# Patient Record
Sex: Female | Born: 1935 | Race: White | Hispanic: No | Smoking: Never smoker
Health system: Southern US, Community
[De-identification: ages and names within clinical notes are randomized; demographics above are authoritative.]

## PROBLEM LIST (undated history)

## (undated) DIAGNOSIS — I739 Peripheral vascular disease, unspecified: Secondary | ICD-10-CM

## (undated) DIAGNOSIS — E785 Hyperlipidemia, unspecified: Secondary | ICD-10-CM

## (undated) DIAGNOSIS — I10 Essential (primary) hypertension: Secondary | ICD-10-CM

## (undated) DIAGNOSIS — H409 Unspecified glaucoma: Secondary | ICD-10-CM

## (undated) HISTORY — DX: Hyperlipidemia, unspecified: E78.5

## (undated) HISTORY — DX: Unspecified glaucoma: H40.9

## (undated) HISTORY — DX: Essential (primary) hypertension: I10

## (undated) HISTORY — DX: Peripheral vascular disease, unspecified: I73.9

## (undated) HISTORY — PX: EYE SURGERY: SHX253

---

## 2012-07-15 ENCOUNTER — Inpatient Hospital Stay: Payer: Self-pay | Admitting: Surgery

## 2012-07-15 LAB — CBC
MCHC: 33.8 g/dL (ref 32.0–36.0)
Platelet: 257 10*3/uL (ref 150–440)
RDW: 13 % (ref 11.5–14.5)
WBC: 6.1 10*3/uL (ref 3.6–11.0)

## 2012-07-15 LAB — COMPREHENSIVE METABOLIC PANEL
Albumin: 3.7 g/dL (ref 3.4–5.0)
Anion Gap: 8 (ref 7–16)
Glucose: 105 mg/dL — ABNORMAL HIGH (ref 65–99)
Osmolality: 282 (ref 275–301)
Potassium: 3.8 mmol/L (ref 3.5–5.1)
SGPT (ALT): 23 U/L (ref 12–78)
Sodium: 141 mmol/L (ref 136–145)
Total Protein: 6.8 g/dL (ref 6.4–8.2)

## 2012-07-15 LAB — LIPASE, BLOOD: Lipase: 157 U/L (ref 73–393)

## 2012-07-15 LAB — URINALYSIS, COMPLETE
Glucose,UR: NEGATIVE mg/dL (ref 0–75)
Leukocyte Esterase: NEGATIVE
Ph: 7 (ref 4.5–8.0)
Protein: NEGATIVE
RBC,UR: 13 /HPF (ref 0–5)
WBC UR: 1 /HPF (ref 0–5)

## 2012-07-16 LAB — COMPREHENSIVE METABOLIC PANEL
Albumin: 3 g/dL — ABNORMAL LOW (ref 3.4–5.0)
Anion Gap: 7 (ref 7–16)
BUN: 12 mg/dL (ref 7–18)
Calcium, Total: 8.7 mg/dL (ref 8.5–10.1)
Chloride: 109 mmol/L — ABNORMAL HIGH (ref 98–107)
EGFR (African American): >60
EGFR (Non-African Amer.): >60
Osmolality: 281 (ref 275–301)
Potassium: 3.5 mmol/L (ref 3.5–5.1)
Sodium: 141 mmol/L (ref 136–145)
Total Protein: 5.9 g/dL — ABNORMAL LOW (ref 6.4–8.2)

## 2012-07-16 LAB — CBC WITH DIFFERENTIAL/PLATELET
Basophil %: 0.9 %
Eosinophil %: 2.3 %
HCT: 39.2 % (ref 35.0–47.0)
HGB: 13.2 g/dL (ref 12.0–16.0)
Lymphocyte #: 1.5 10*3/uL (ref 1.0–3.6)
MCH: 31.5 pg (ref 26.0–34.0)
MCHC: 33.7 g/dL (ref 32.0–36.0)
MCV: 93 fL (ref 80–100)
Monocyte #: 0.6 x10 3/mm (ref 0.2–0.9)
Monocyte %: 8.1 %
Neutrophil #: 4.9 10*3/uL (ref 1.4–6.5)
Platelet: 255 10*3/uL (ref 150–440)

## 2012-07-20 LAB — PLATELET COUNT: Platelet: 251 10*3/uL (ref 150–440)

## 2012-07-25 ENCOUNTER — Ambulatory Visit: Payer: Self-pay | Admitting: Surgery

## 2012-07-25 LAB — URINALYSIS, COMPLETE
Bilirubin,UR: NEGATIVE
Nitrite: NEGATIVE
Specific Gravity: 1.014 (ref 1.003–1.030)
Squamous Epithelial: 1
WBC UR: 12 /HPF (ref 0–5)

## 2012-07-27 LAB — URINE CULTURE

## 2012-12-04 ENCOUNTER — Emergency Department: Payer: Self-pay | Admitting: Internal Medicine

## 2012-12-09 ENCOUNTER — Emergency Department: Payer: Self-pay | Admitting: Emergency Medicine

## 2013-01-21 ENCOUNTER — Ambulatory Visit: Payer: Self-pay | Admitting: Ophthalmology

## 2013-02-18 ENCOUNTER — Ambulatory Visit: Payer: Self-pay | Admitting: Ophthalmology

## 2014-06-05 IMAGING — CT CT HEAD WITHOUT CONTRAST
1 of 2 series · 13 of 30 positions shown, 17 images · non-contrast
Comparison: none

REASON FOR EXAM: s/p fall
COMMENTS:

[Series 4: soft tissue 2 · axial · 0.40mm/px · z∈[+1305,+1430]mm · 13 of 31 slices shown, 17 images]
[im 3/31  brain]
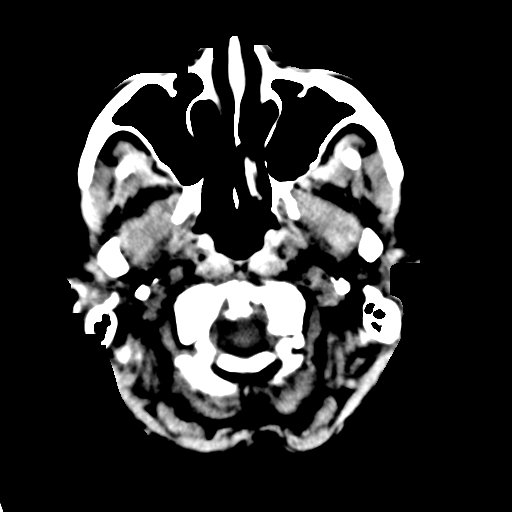
[im 3/31  bone]
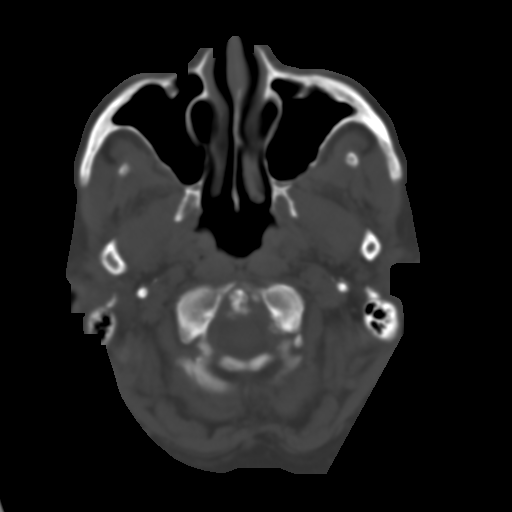
[im 5/31  brain]
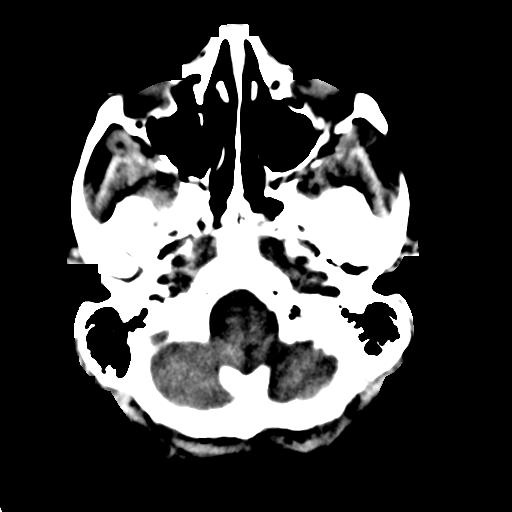
[im 7/31  brain]
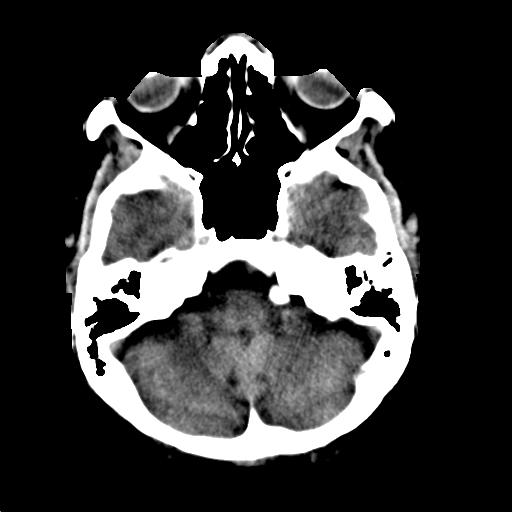
[im 9/31  brain]
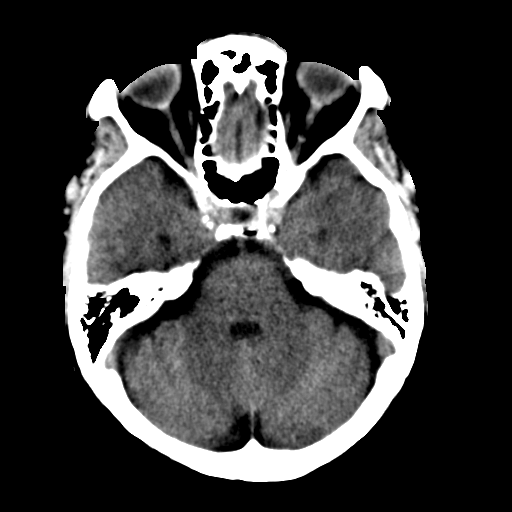
[im 11/31  brain]
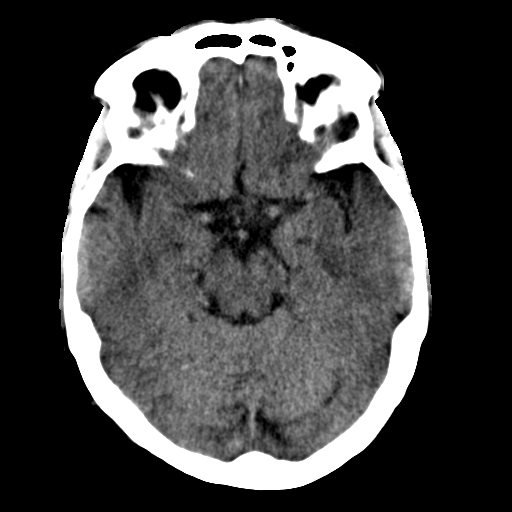
[im 11/31  bone]
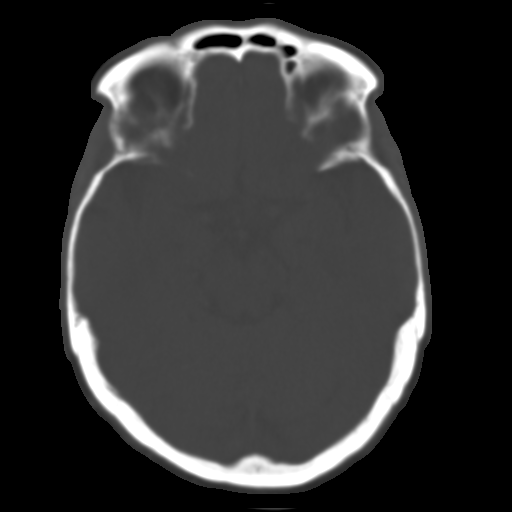
[im 13/31  brain]
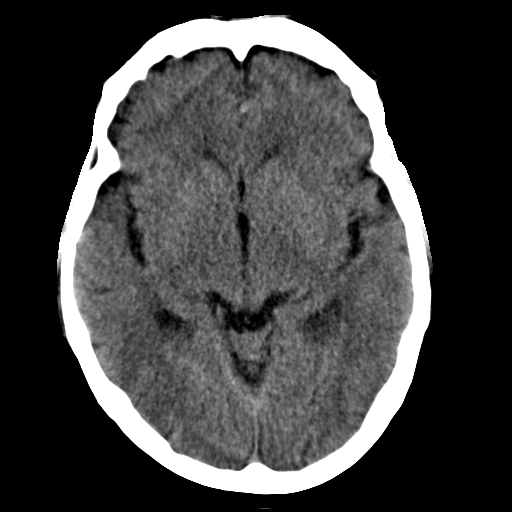
[im 16/31  brain]
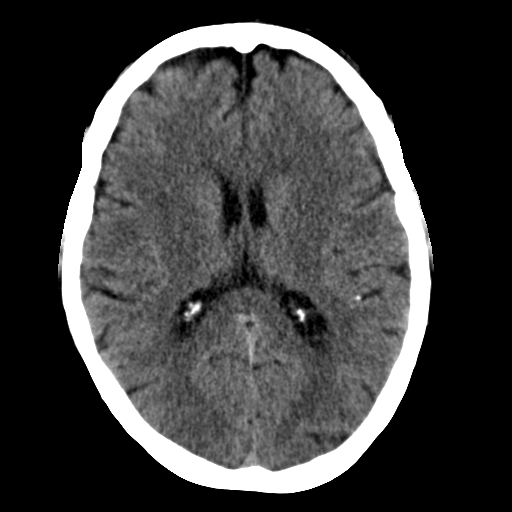
[im 18/31  brain]
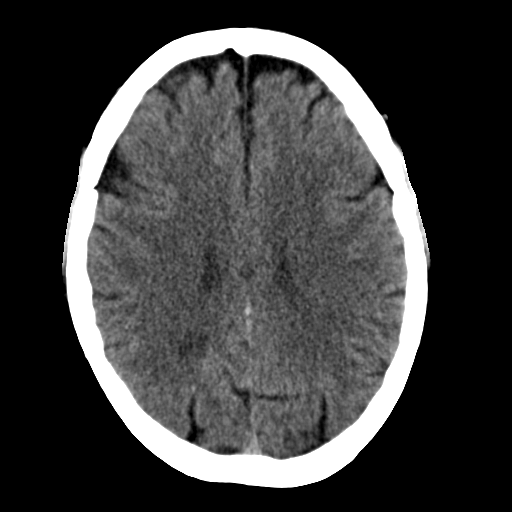
[im 20/31  brain]
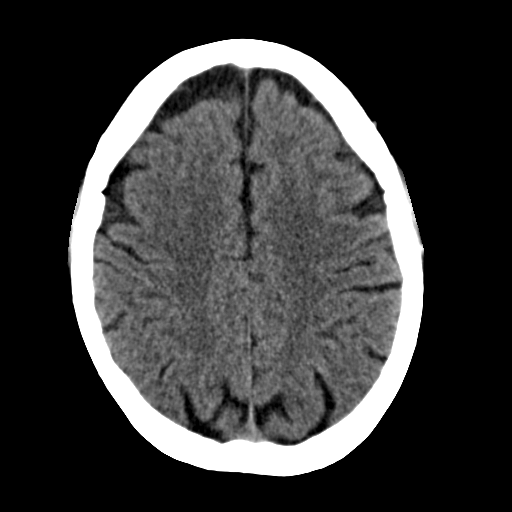
[im 20/31  bone]
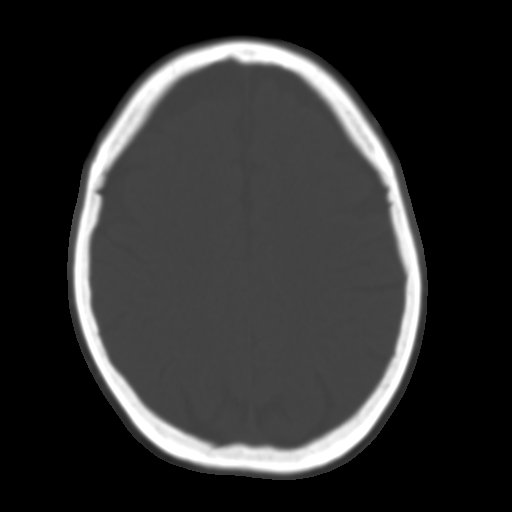
[im 22/31  brain]
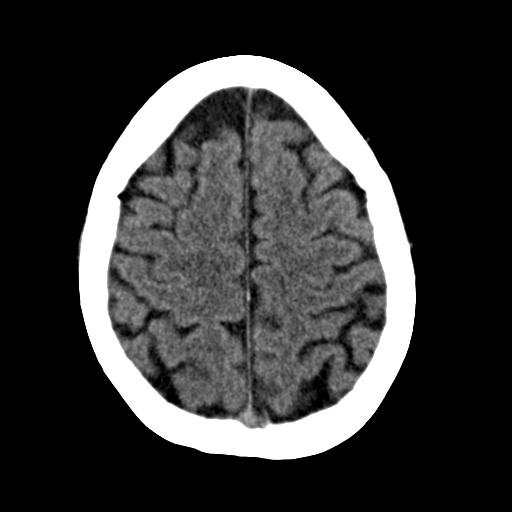
[im 24/31  brain]
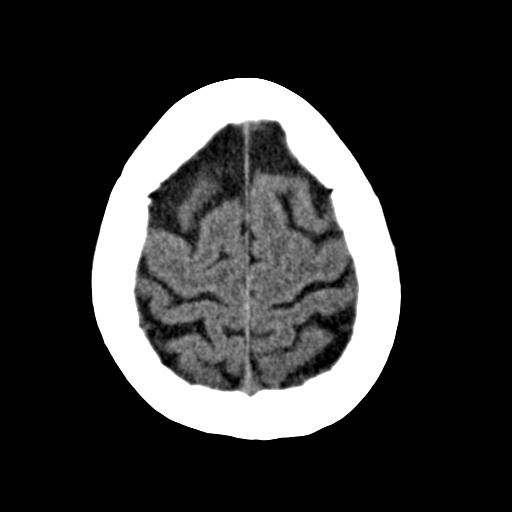
[im 26/31  brain]
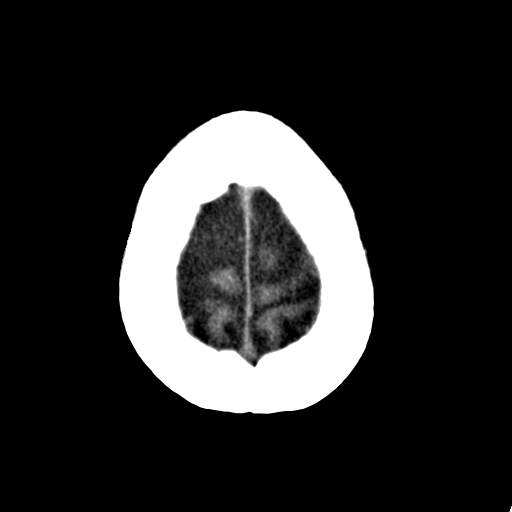
[im 28/31  brain]
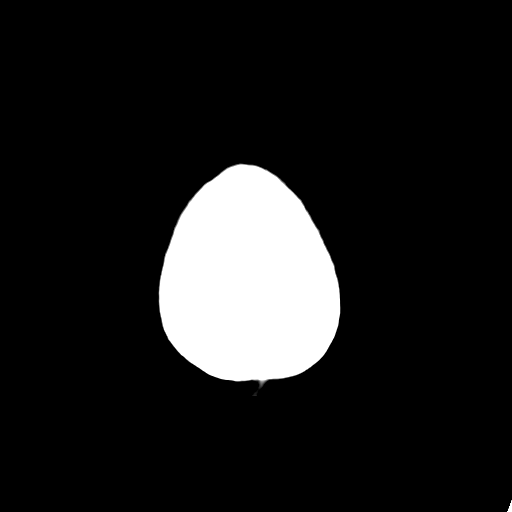
[im 28/31  bone]
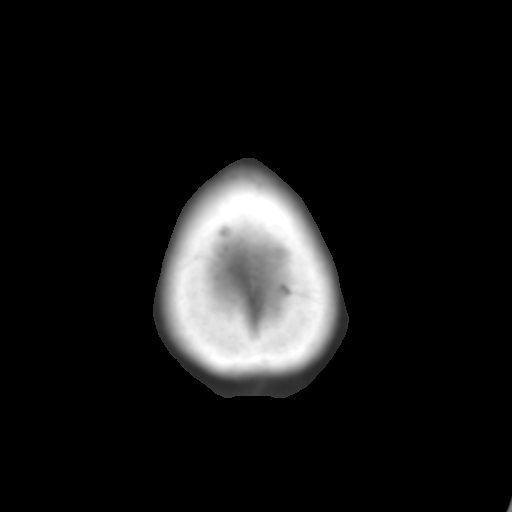

[13 of 30 positions shown; findings below may reference images not displayed]

PROCEDURE:     CT  - CT HEAD WITHOUT CONTRAST  - December 04, 2012  [DATE]

RESULT:     Axial noncontrast CT scanning was performed through the brain
with reconstructions at 5 mm intervals and slice thicknesses. There are no
previous studies for comparison.

There is very mild age-appropriate diffuse cerebral and cerebellar atrophy.
The ventricles are normal in size and position. There is no intracranial
hemorrhage nor intracranial mass effect. There is decreased density in the
deep white matter of both parietal lobes consistent with mild changes of
chronic small vessel ischemia. There is no evidence of an evolving ischemic
infarction.

At bone window settings soft tissue swelling and probable laceration is
noted over the forehead especially on the left. There is no evidence of an
acute skull fracture. The observed portions of the paranasal sinuses and
mastoid air cells are clear.
IMPRESSION: 1. There is soft tissue swelling over the forehead. There is no evidence of
an acute skull fracture.
2. There is no acute intracranial hemorrhage nor evidence of an evolving
ischemic infarction.
3. There are mild age related changes present in both cerebral hemispheres.

[REDACTED]

## 2014-11-10 NOTE — Op Note (Signed)
PATIENT NAME:  Amy [name], Amy [name] MR#:  [id number] DATE OF BIRTH:  1927-03-29  DATE OF PROCEDURE:  07/16/2012  ATTENDING PHYSICIAN:  Salome Holmeshris Contrell Ballentine, M.D.  DATE OF SURGERY. 07/16/2012.   PREOPERATIVE DIAGNOSIS: Incarcerated epigastric hernia.   POSTOPERATIVE DIAGNOSIS: Incarcerated epigastric hernia x 1, an incarcerated epigastric hernia inferior to this x 2 and reducible umbilical hernia x 3.   PROCEDURE PERFORMED:  1.  Repair of epigastric hernia x 2 and umbilical hernia by connecting hernias and repairing 4 x 4 cm defect with 8 x 8 cm Ventralex patch.  2.  Partial omentectomy.   ESTIMATED BLOOD LOSS: 20 mL.   ANESTHESIA: General.   SPECIMEN: Incarcerated omentum.   INDICATION FOR SURGERY: The patient is an [age] year old female who presented with acute onset of epigastric pain and a hard mass. This had reduced and she was found to have epigastric hernia. Due to the fact that she had a large amount of pain, [name] was afraid that she could end up having incarcerated bowel through this small defect and, therefore, elected to bring her to the Operating Room for repair of epigastric hernia. She also was noted to have an umbilical hernia and [name] elected to repair that if it was easy to do so at that time.   DETAILS OF PROCEDURE: Informed consent was obtained. The patient was brought to the Operating Room suite. She was induced, endotracheal tube was placed and general anesthesia was administered. Antibiotics were administered. Her abdomen was then prepped and draped in standard surgical fashion. A timeout was then performed, correctly identifying the patient name, operative site and procedure to be performed.   [name] then made a supraumbilical incision above the palpated hernia. [name] was able to visualize the hernia sac immediately.  [name] then encircled the hernia and there was a large piece of incarcerated omentum. [name] opened the sac, tried to reduce the omentum, but was unable to so, therefore, did an omentectomy and  was able to reduce the omentum. [name] then cleared out the fascia adjacent and there was an approximately 2 x 2 cm defect. [name] then made sure the underside was cleaned and there was no obvious adherent bowel. [name] then tracked inferiorly and found a small approximately 0.5 x 0.5 cm epigastric hernia and then [name] went deep and further down and found an umbilical hernia. To take the umbilical hernia at the umbilicus [name] used a Kelly clamp to encircle the umbilical stalk and then carefully divided it.  [name] then got rid of all the adjacent sac to the defects and incised a small amount of tissue between each of them to make a total 4 x 4 cm defect. [name] then elected to repair this.  [name] used an 8 x 8 cm Ventralex patch which fit nicely through the hole and underneath, and gave an amount of overlap. [name] then placed 4 interrupted 0 Prolene sutures at the 4 corners through the fascia and through the anterior part of the patch.   [name] then closed the midline incision using a running #1 Prolene and grabbing a piece of the tails of the Ventralex patch to incorporate this patch into the repair.  [name] then used a couple of figure-of-eights to further close the fascia where it appeared to be a little bit loose. [name] then used a simple 3-0 Vicryl U-stitch to attach the repair to the umbilicus to reform her umbilicus, and then irrigated the wound and closed it in 3 layers; a running 3-0 Vicryl to close some  of the subcutaneous tissue and close the dead space, a running 3-0 Vicryl deep dermal and a running 4-0 Monocryl subcuticular. [name] then used Steri-Strips and gauze and Telfa and Tegaderm to complete the dressing. She was then awoken, extubated and brought to the postanesthesia care unit. There were no immediate complications. Needle, sponge and instrument counts were correct at the end of the procedure.    ____________________________ Si Raider. Amy Barb, MD cal:cs D: 07/16/2012 17:14:19 ET T: 07/16/2012 19:36:51  ET JOB#: [id number]  cc: Cristal Deer A. Lucas Exline, MD, <Dictator> Jarvis Newcomer MD ELECTRONICALLY SIGNED 07/17/2012 13:52

## 2014-11-10 NOTE — H&P (Signed)
PATIENT NAME:  Amy [name], Amy [name] MR#:  [id number] DATE OF BIRTH:  April 10, 1927  DATE OF ADMISSION:  07/15/2012  ATTENDING PHYSICIAN: Cristal Deerhristopher A. Janessa Mickle, M.D.   REASON FOR CONSULTATION: Abdominal pain x1 day and firm epigastric bulge.   HISTORY OF PRESENT ILLNESS: Amy [name] is a pleasant 81 year old female who is not on any standard medications at home but has a history a hysterectomy, who presented with a one-day history of epigastric bulge. She said that she has felt it before, and when she was leaning over and straining she felt it pop out.  It became hard and became painful. It did not improve with lying down or anything like that. No nausea, vomiting, diarrhea, constipation, but she was brought to the ED.  She did receive some morphine, and it is much softer now and improved. No fevers, chills, night sweats, shortness of breath, cough, chest pain, nausea, vomiting, diarrhea, constipation, dysuria or hematuria. She  has never had colonoscopy before.   PAST SURGICAL HISTORY:   1. Status post hysterectomy, total.  2. Epigastric hernia. 3. Umbilical hernia.   HOME MEDICATIONS: Ibuprofen p.r.n.   ALLERGIES: No known drug allergies.   SOCIAL HISTORY: She is here with her daughter who lives in [place]. No tobacco or alcohol use. No other drug use.   FAMILY HISTORY: Denies cancer, or heart disease, or hypertension or diabetes.   FAMILY HISTORY: He does have family history of stroke.   REVIEW OF SYSTEMS: A total 12-point review of systems is obtained. Pertinent positives and negatives as above.    PHYSICAL EXAMINATION: VITAL SIGNS: Temperature 86, blood pressure 180/70, respirations 20, 98% on room air.  HEAD: Normocephalic, atraumatic.  EYES: No scleral icterus. No conjunctivitis.  HEAD: Normocephalic, atraumatic. No obvious facial injuries. No obvious facial trauma. Normal external nose. Normal external ears.  NECK: Supple. No obvious masses.  HEART: Regular rate and rhythm. No  murmurs, rubs, or gallops.  CHEST: Lungs clear to auscultation, moving air well.  ABDOMEN: Soft, slightly tender epigastrium with a bulge which is not reducible but is relatively nontender. Also, there is an umbilical hernia which is palpable.  EXTREMITIES: Moves all extremities well. Strength 5 out of 6.  Cranial nerves II through XII are grossly intact. . Sensation in all 4 extremities.   LABORATORY AND RADIOLOGICAL DATA:  Significant for CBC is normal. Hemoglobin and hematocrit is normal. Normal urinalysis. Lactate 1.1, creatinine 0.68, potassium 3.8.   CT scan shows an epigastric and umbilical hernia with no obvious bowel.   ASSESSMENT AND PLAN: The patient is a pleasant 81 year old female with a history of epigastric pain and what sounds like a large mass, likely incarcerated hernia, which has been reduced. She is fearful that it will recur and would like to have it fixed as soon as possible. We will admit overnight and plan on operative repair in the morning. [name] have spoken to her about the procedure, and she agrees with it.   ____________________________ Si Raiderhristopher A. Talik Casique, MD cal:cb D: 07/15/2012 15:35:02 ET T: 07/15/2012 15:54:12 ET JOB#: [id number]  cc: Cristal Deerhristopher A. Lamar Naef, MD, <Dictator> Jarvis NewcomerHRISTOPHER A Laithan Conchas MD ELECTRONICALLY SIGNED 07/17/2012 13:51

## 2014-11-13 NOTE — Op Note (Signed)
PATIENT NAME:  Amy [name], [name] MR#:  [id number] DATE OF BIRTH:  1927/01/10  DATE OF PROCEDURE:  01/21/2013  PREOPERATIVE DIAGNOSIS: Visually significant cataract of the left eye.   POSTOPERATIVE DIAGNOSIS: Visually significant cataract of the left eye.   OPERATIVE PROCEDURE: Cataract extraction by phacoemulsification with implant of intraocular lens to left eye.   SURGEON: Galen ManilaWilliam Oluwademilade Kellett, MD   ANESTHESIA:  1. Managed anesthesia care.  2. Topical tetracaine drops followed by 2% Xylocaine jelly applied in the preoperative holding area.   COMPLICATIONS: None.   TECHNIQUE: Stop and chop.  DESCRIPTION OF PROCEDURE: The patient was examined and consented in the preoperative holding area where the aforementioned topical anesthesia was applied to the left eye and then brought back to the Operating Room where the left eye was prepped and draped in the usual sterile ophthalmic fashion and a lid speculum was placed. A paracentesis was created with the side port blade and the anterior chamber was filled with viscoelastic. A near clear corneal incision was performed with the steel keratome. A continuous curvilinear capsulorrhexis was performed with a cystotome followed by the capsulorrhexis forceps. Hydrodissection and hydrodelineation were carried out with BSS on a blunt cannula. The lens was removed in a stop and chop technique and the remaining cortical material was removed with the irrigation-aspiration handpiece. The capsular bag was inflated with viscoelastic and the Alcon SN60WF Tecnis ZCB00 21.5-diopter lens, serial number [id number] was placed in the capsular bag without complication. The remaining viscoelastic was removed from the eye with the irrigation-aspiration handpiece. The wounds were hydrated. The anterior chamber was flushed with Miostat and the eye was inflated to physiologic pressure. 0.1 mL of cefuroxime concentration 10 mg/mL was placed in the anterior chamber. The wounds were found to  be water tight. The eye was dressed with Vigamox. The patient was given protective glasses to wear throughout the day and a shield with which to sleep tonight. The patient was also given drops with which to begin a drop regimen today and will follow-up with me in one day.    ____________________________ Jerilee FieldWilliam L. Makeda Peeks, MD wlp:jm D: 01/21/2013 17:28:37 ET T: 01/21/2013 20:52:11 ET JOB#: [id number]  cc: Ludy Messamore L. Tanner Vigna, MD, <Dictator> Jerilee FieldWILLIAM L Cecile Guevara MD ELECTRONICALLY SIGNED 01/22/2013 15:59

## 2014-11-13 NOTE — Op Note (Signed)
PATIENT NAME:  Amy [name], Amy [name] MR#:  [id number] DATE OF BIRTH:  [birth year]  DATE OF PROCEDURE:  02/18/2013  LOCATION:  [facility]  PREOPERATIVE DIAGNOSIS: Visually significant cataract of the right eye.   POSTOPERATIVE DIAGNOSIS: Visually significant cataract of the right eye.   OPERATIVE PROCEDURE: Cataract extraction by phacoemulsification with implant of intraocular lens to right eye.   SURGEON: Galen ManilaWilliam Levar Fayson, MD.   ANESTHESIA:  1. Managed anesthesia care.  2. Topical tetracaine drops followed by 2% Xylocaine jelly applied in the preoperative holding area.   COMPLICATIONS: None.   TECHNIQUE:  Stop and chop.  DESCRIPTION OF PROCEDURE: The patient was examined and consented in the preoperative holding area where the aforementioned topical anesthesia was applied to the right eye and then brought back to the Operating Room where the right eye was prepped and draped in the usual sterile ophthalmic fashion and a lid speculum was placed. A paracentesis was created with the side port blade and the anterior chamber was filled with viscoelastic. A near clear corneal incision was performed with the steel keratome. A continuous curvilinear capsulorrhexis was performed with a cystotome followed by the capsulorrhexis forceps. Hydrodissection and hydrodelineation were carried out with BSS on a blunt cannula. The lens was removed in a stop and chop technique and the remaining cortical material was removed with the irrigation-aspiration handpiece. The capsular bag was inflated with viscoelastic and the Technis ZCB00 21.0-diopter lens, serial number [id number] was placed in the capsular bag without complication. The remaining viscoelastic was removed from the eye with the irrigation-aspiration handpiece. The wounds were hydrated. The anterior chamber was flushed with Miostat and the eye was inflated to physiologic pressure. 0.1 mL of cefuroxime concentration 10 mg/mL was placed in the anterior  chamber. The wounds were found to be water tight. The eye was dressed with Vigamox and Combigan. The patient was given protective glasses to wear throughout the day and a shield with which to sleep tonight. The patient was also given drops with which to begin a drop regimen today and will follow-up with me in one day.     ____________________________ Jerilee FieldWilliam L. Izzy Doubek, MD wlp:dp D: 02/18/2013 11:13:57 ET T: 02/18/2013 11:29:19 ET JOB#: [id number]  cc: Nekita Pita L. Adewale Pucillo, MD, <Dictator> Jerilee FieldWILLIAM L Shasta Chinn MD ELECTRONICALLY SIGNED 02/21/2013 10:08

## 2014-11-13 NOTE — Discharge Summary (Signed)
PATIENT NAME:  Amy [name], Amy [name] MR#:  [id number] DATE OF BIRTH:  05-31-1927  DATE OF ADMISSION:  07/15/2012 DATE OF DISCHARGE:  07/20/2012  DISCHARGE DIAGNOSES:  1.  Incarcerated epigastric hernia x 2 and umbilical hernia.  2.  Status post hysterectomy.   DISCHARGE MEDICATIONS:  1.  Tramadol 50 mg p.o. q.6 h. p.r.n. pain.  2.  Senokot 1 tab p.o. daily.   REASON FOR ADMISSION: The patient is a pleasant 81 year old female who presents with acute onset epigastric pain and a bulge which was hard and tender. It had improved and she had a CT scan which showed herniated fat. [name] thus admitted her for epigastric hernia repair with mesh.   HOSPITAL COURSE: The patient was admitted and on the 24th underwent epigastric and umbilical hernia repair with 1 piece of 8 x 8 Ventralex patch mesh. Postoperatively, the patient was advanced from clears to regular diet. She was converted from IV to p.o. pain control. She was constipated and we gave her MiraLax and eventually suppositories to get her bowel moving. At the time of discharge was tolerating good p.o., had good p.o. pain control and was voiding and stooling without difficulties.   DISCHARGE INSTRUCTIONS: The patient is to follow up with me as an outpatient. She is to call or return to the ED if she has increased pain, nausea, vomiting, redness or drainage from incision and difficulty in bowel movements.   ____________________________ Si Raiderhristopher A. Emmanuelle Coxe, MD cal:jm D: 07/26/2012 13:08:55 ET T: 07/26/2012 14:10:03 ET JOB#: [id number]  cc: Cristal Deerhristopher A. Lupita Rosales, MD, <Dictator> Jarvis NewcomerHRISTOPHER A Dillon Mcreynolds MD ELECTRONICALLY SIGNED 07/30/2012 18:33

## 2017-04-22 ENCOUNTER — Emergency Department
Admission: EM | Admit: 2017-04-22 | Discharge: 2017-04-22 | Disposition: A | Discharge status: Home / Self Care | Payer: Medicare Other | Attending: Emergency Medicine | Admitting: Emergency Medicine

## 2017-04-22 ENCOUNTER — Emergency Department: Payer: Medicare Other

## 2017-04-22 DIAGNOSIS — I8002 Phlebitis and thrombophlebitis of superficial vessels of left lower extremity: Secondary | ICD-10-CM

## 2017-04-22 DIAGNOSIS — R2242 Localized swelling, mass and lump, left lower limb: Secondary | ICD-10-CM | POA: Diagnosis present

## 2017-04-22 NOTE — ED Triage Notes (Addendum)
Patient arrives via wheelchair from [facility] for R/O DVT Left Leg varicose vein

## 2017-04-22 NOTE — ED Provider Notes (Signed)
[facility] Emergency Department Provider Note  ____________________________________________   First MD Initiated Contact with Patient 04/22/17 1529     (approximate)  I have reviewed the triage vital signs and the nursing notes.   HISTORY  Chief Complaint Leg Pain   HPI Amy [name] is a 83 y.o. female who is presenting to the emergency Department today with swelling and tenderness to her posterior left calf. She says that it is been ongoing since this past Tuesday when she thought she "pulled something" while working in her garden. she said that she then noticed the hardened mass and started wearing compression hose. She thinks that the mass has decreased in size about a week but a friend who is a nurse recommended that she present to the hospital for further restoration for concern for blood clot. She has history of varicose veins which she reports has been there since high school.   No past medical history on file.  There are no active problems to display for this patient.   No past surgical history on file.  Prior to Admission medications   Not on File    Allergies Patient has no allergy information on record.  No family history on file.  Social History Social History  Substance Use Topics  . Smoking status: Not on file  . Smokeless tobacco: Not on file  . Alcohol use Not on file    Review of Systems  Constitutional: No fever/chills Eyes: No visual changes. ENT: No sore throat. Cardiovascular: Denies chest pain. Respiratory: Denies shortness of breath. Gastrointestinal: No abdominal pain.  No nausea, no vomiting.  No diarrhea.  No constipation. Genitourinary: Negative for dysuria. Musculoskeletal: Negative for back pain. Skin: Negative for rash. Neurological: Negative for headaches, focal weakness or numbness.   ____________________________________________   PHYSICAL EXAM:  VITAL SIGNS: ED Triage Vitals  Enc Vitals  Group     BP 04/22/17 1505 (!) 171/69     Pulse Rate 04/22/17 1505 (!) 56     Resp 04/22/17 1505 18     Temp 04/22/17 1505 98.9 F (37.2 C)     Temp Source 04/22/17 1505 Oral     SpO2 04/22/17 1505 97 %     Weight 04/22/17 1505 163 lb (73.9 kg)     Height 04/22/17 1505  (1.575 m)     Head Circumference --      Peak Flow --      Pain Score 04/22/17 1500 4     Pain Loc --      Pain Edu? --      Excl. in GC? --     Constitutional: Alert and oriented. Well appearing and in no acute distress. Eyes: Conjunctivae are normal.  Head: Atraumatic. Nose: No congestion/rhinnorhea. Mouth/Throat: Mucous membranes are moist.  Neck: No stridor.   Cardiovascular: Normal rate, regular rhythm. Grossly normal heart sounds.  Good peripheral circulation. Respiratory: Normal respiratory effort.  No retractions. Lungs CTAB. Gastrointestinal: Soft and nontender. No distention. No CVA tenderness. Musculoskeletal: mild edema to bilateral lower extremities which is equal. No erythema. Mild tenderness to a hardened mass to the posterior calf which does have some ropelike texture within it. The masses to the posterior of the calf at the proximal third and does not extend proximal to the knee. There is no warmth. Bilateral dorsalis pedis pulses are intact.   Neurologic:  Normal speech and language. No gross focal neurologic deficits are appreciated. Skin:  Skin is warm, dry and  intact. No rash noted. Psychiatric: Mood and affect are normal. Speech and behavior are normal.  ____________________________________________   LABS (all labs ordered are listed, but only abnormal results are displayed)  Labs Reviewed - No data to display ____________________________________________  EKG   ____________________________________________  RADIOLOGY  thrombosed varicose veins detected without DVT. ____________________________________________   PROCEDURES  Procedure(s) performed:    Procedures  Critical Care performed:   ____________________________________________   INITIAL IMPRESSION / ASSESSMENT AND PLAN / ED COURSE  Pertinent labs & imaging results that were available during my care of the patient were reviewed by me and considered in my medical decision making (see chart for details).  DDX: Cellulitis, DVT, superficial thrombophlebitis, muscle injury, thrombosed varicose veins ----------------------------------------- 4:53 PM on 04/22/2017 -----------------------------------------  patient without any acute distress at this time. Patient will begin taking 81 mg of her baby aspirin daily instead of every other day at this time. She will continue to use her compression hose and keep the leg elevated and use warm, versus several times per day. She is aware of the diagnosis as well as the treatment plan. She'll be following up with her primary care doctor, Dr. Judithann Sheen, within 7 days.      ____________________________________________   FINAL CLINICAL IMPRESSION(S) / ED DIAGNOSES  thrombosed varicose veins    NEW MEDICATIONS STARTED DURING THIS VISIT:  New Prescriptions   No medications on file     Note:  This document was prepared using Dragon voice recognition software and may include unintentional dictation errors.     Myrna Blazer, MD 04/22/17 [phone]

## 2017-05-14 ENCOUNTER — Encounter (INDEPENDENT_AMBULATORY_CARE_PROVIDER_SITE_OTHER): Payer: Self-pay | Admitting: Vascular Surgery

## 2017-05-14 ENCOUNTER — Ambulatory Visit (INDEPENDENT_AMBULATORY_CARE_PROVIDER_SITE_OTHER): Payer: Medicare Other | Admitting: Vascular Surgery

## 2017-05-14 VITALS — BP 172/77 | HR 60 | Resp 15 | Ht 60.0 in | Wt 162.0 lb

## 2017-05-14 DIAGNOSIS — I8002 Phlebitis and thrombophlebitis of superficial vessels of left lower extremity: Secondary | ICD-10-CM | POA: Insufficient documentation

## 2017-05-14 DIAGNOSIS — I83893 Varicose veins of bilateral lower extremities with other complications: Secondary | ICD-10-CM | POA: Diagnosis not present

## 2017-05-14 NOTE — Progress Notes (Signed)
Subjective:    Patient ID: Amy [name], female    DOB: 04/23/1927, 83 y.o.   MRN: [id number] Chief Complaint  Patient presents with  . New Patient (Initial Visit)    Phlebitis   Presents as a new patient referred by Dr. Judithann Sheen for "phlebitis". Patient endorses a history of being diagnosed with thrombosed superficial varicose veins located on the left posterior calf on 04/22/17. Patient states that she was working in her garden cleaning up after [employer] and noticed a "hard area" on the back of her calf that evening. She denied any pain to the area. The patient went to an urgent care the following day and underwent a left lower extremity venous duplex which was notable for no DVT but notable for thrombosed superficial veins of the left posterior calf. The patient followed up with Dr. Judithann Sheen and was placed on a baby aspirin, encouraged to wear compression stockings, encouraged to elevate her legs, encouraged to use warm compresses. She presents today with some improvement to the area. She denies any pain to the area or ulceration. Patient denies any claudication, rest pain or ulceration to the lower extremity. Patient denies any surgery or trauma to the lower extremity. Patient does note that her mother died when she was 25 due to a blood clot. Patient denies any fever, nausea or vomiting.   Review of Systems  Constitutional: Negative.   HENT: Negative.   Eyes: Negative.   Respiratory: Negative.   Cardiovascular:       Thrombophlebitis to the left calf  Gastrointestinal: Negative.   Endocrine: Negative.   Genitourinary: Negative.   Musculoskeletal: Negative.   Skin: Negative.   Allergic/Immunologic: Negative.   Neurological: Negative.   Hematological: Negative.   Psychiatric/Behavioral: Negative.       Objective:   Physical Exam  Constitutional: She is oriented to person, place, and time. She appears well-developed. No distress.  HENT:  Head: Normocephalic and atraumatic.    Eyes: Pupils are equal, round, and reactive to light. Conjunctivae are normal.  Neck: Normal range of motion.  Cardiovascular: Normal rate, regular rhythm, normal heart sounds and intact distal pulses.   Pulses:      Radial pulses are 2+ on the right side, and 2+ on the left side.       Dorsalis pedis pulses are 2+ on the right side, and 2+ on the left side.       Posterior tibial pulses are 2+ on the right side, and 2+ on the left side.  Left lower extremity: There is a hard cluster of varicosities noted on the posterior left calf. They are nontender to palpation. There is no infection noted. There is no cellulitis noted. There is no ulceration noted.  Pulmonary/Chest: Effort normal.  Musculoskeletal: Normal range of motion. She exhibits edema (Mild bilateral lower extremity edema).  Neurological: She is alert and oriented to person, place, and time.  Skin: She is not diaphoretic.  Less than 1 cm scattered varicosities noted to the bilateral lower extremity. There is no stasis dermatitis.  Psychiatric: She has a normal mood and affect. Her behavior is normal. Judgment and thought content normal.  Vitals reviewed.  BP (!) 172/77 (BP Location: Right Arm)   Pulse 60   Resp 15   Ht 5' (1.524 m)   Wt 162 lb (73.5 kg)   BMI 31.64 kg/m   Past Medical History:  Diagnosis Date  . Glaucoma   . Hyperlipidemia   . Hypertension   .  Peripheral vascular disease [facility](HCC)    Social History   Social History  . Marital status: Widowed    Spouse name: N/A  . Number of children: N/A  . Years of education: N/A   Occupational History  . Not on file.   Social History Main Topics  . Smoking status: Never Smoker  . Smokeless tobacco: Never Used  . Alcohol use No  . Drug use: Unknown  . Sexual activity: Not on file   Other Topics Concern  . Not on file   Social History Narrative  . No narrative on file   Past Surgical History:  Procedure Laterality Date  . EYE SURGERY     No family  history on file.  Allergies  Allergen Reactions  . Other     Other reaction(s): Unknown Vitamins  . Sulfa Antibiotics Rash      Assessment & Plan:  Presents as a new patient referred by Dr. Judithann SheenSparks for "phlebitis". Patient endorses a history of being diagnosed with thrombosed superficial varicose veins located on the left posterior calf on 04/22/17. Patient states that she was working in her garden cleaning up [place] [employer] and noticed a "hard area" on the back of her calf that evening. She denied any pain to the area. The patient went to an urgent care the following day and underwent a left lower extremity venous duplex which was notable for no DVT but notable for thrombosed superficial veins of the left posterior calf. The patient followed up with Dr. Judithann SheenSparks and was placed on a baby aspirin, encouraged to wear compression stockings, encouraged to elevate her legs, encouraged to use warm compresses. She presents today with some improvement to the area. She denies any pain to the area or ulceration. Patient denies any claudication, rest pain or ulceration to the lower extremity. Patient denies any surgery or trauma to the lower extremity. Patient does note that her mother died when she was [age] due to a blood clot. Patient denies any fever, nausea or vomiting.  1. Varicose veins of bilateral lower extremities with other complications - New Patient with an area of thrombosed superficial veins diagnosed about one month ago. Patient to continue taking baby aspirin daily, wearing 20-30 mmHg compression stockings, elevating her legs and using warm compresses to the area. There is no vascular compromise to the left lower extremity The patient understands her body needs to reabsorb the blood clot at this area and I can take weeks to months. There is no pain to the area I will order a bilateral venous reflux to rule out venous disease Patient to follow up in 1 month.  - VAS US LOWER EXTREMITY  VENOUS REFLUX; Future  2. Phlebitis and thombophlb of superfic vessels of l low extrem - New As above  No current outpatient prescriptions on file prior to visit.   No current facility-administered medications on file prior to visit.     There are no Patient Instructions on file for this visit. No Follow-up on file.   Rawad Bochicchio A Ayjah Show, PA-C

## 2017-06-11 ENCOUNTER — Ambulatory Visit (INDEPENDENT_AMBULATORY_CARE_PROVIDER_SITE_OTHER): Payer: Medicare Other | Admitting: Vascular Surgery

## 2017-06-11 ENCOUNTER — Encounter (INDEPENDENT_AMBULATORY_CARE_PROVIDER_SITE_OTHER): Payer: Medicare Other

## 2017-06-13 ENCOUNTER — Ambulatory Visit (INDEPENDENT_AMBULATORY_CARE_PROVIDER_SITE_OTHER): Payer: Medicare Other

## 2017-06-13 ENCOUNTER — Encounter (INDEPENDENT_AMBULATORY_CARE_PROVIDER_SITE_OTHER): Payer: Self-pay | Admitting: Vascular Surgery

## 2017-06-13 ENCOUNTER — Ambulatory Visit (INDEPENDENT_AMBULATORY_CARE_PROVIDER_SITE_OTHER): Payer: Medicare Other | Admitting: Vascular Surgery

## 2017-06-13 VITALS — BP 130/78 | HR 55 | Resp 16 | Wt 162.0 lb

## 2017-06-13 DIAGNOSIS — I83893 Varicose veins of bilateral lower extremities with other complications: Secondary | ICD-10-CM

## 2017-06-13 DIAGNOSIS — I8002 Phlebitis and thrombophlebitis of superficial vessels of left lower extremity: Secondary | ICD-10-CM

## 2017-06-13 NOTE — Progress Notes (Signed)
Subjective:    Patient ID: Amy AlconEsther I [name], female    DOB: Jun 22, 1927, 83 y.o.   MRN: [id number] Chief Complaint  Patient presents with  . Follow-up    11mo bil ven reflux   Patient presents to review vascular studies.  The patient was last seen approximately 1 month ago for evaluation of left lower extremity phlebitis.  Since her last visit, the patient has been taking a daily baby aspirin, wearing compression stockings, elevating her legs and using warm compresses to the back of her calf.  The patient notes the "knot" located on the back of her left calf has progressively gotten smaller and less tender.  The patient denies any shortness of breath or chest pain.  The patient underwent a bilateral lower extremity venous reflux exam which was notable for venous incompetence in the right great saphenous, left anterior accessory saphenous and bilateral common femoral veins.  Partially occlusive thrombus noted from near the left saphenopopliteal junction to the proximal calf.  The left proximal to mid calf as well as an adjacent bundle varicose veins in the mid calf, the left small saphenous vein show occlusive thrombus with mixed echogenicity.  No superficial vein thrombosis in the right lower extremity.  No evidence of deep vein thrombosis in the bilateral lower extremities.  The patient denies any fever, nausea vomiting.   Review of Systems  Constitutional: Negative.   HENT: Negative.   Eyes: Negative.   Respiratory: Negative.   Cardiovascular:       Left calf swelling and pain  Gastrointestinal: Negative.   Endocrine: Negative.   Genitourinary: Negative.   Musculoskeletal: Negative.   Skin: Negative.   Allergic/Immunologic: Negative.   Neurological: Negative.   Hematological: Negative.   Psychiatric/Behavioral: Negative.       Objective:   Physical Exam  Constitutional: She is oriented to person, place, and time. She appears well-developed and well-nourished. No distress.  HENT:    Head: Normocephalic and atraumatic.  Eyes: Conjunctivae are normal. Pupils are equal, round, and reactive to light.  Neck: Normal range of motion.  Cardiovascular: Normal rate, regular rhythm, normal heart sounds and intact distal pulses.  Pulses:      Radial pulses are 2+ on the right side, and 2+ on the left side.       Dorsalis pedis pulses are 2+ on the right side, and 2+ on the left side.       Posterior tibial pulses are 2+ on the right side, and 2+ on the left side.  Left calf: Nontender to palpation.  No pain with dorsiflexion.  There is a 2 cm x 2 cm knot located on the back of the calf.  Skin is intact.  There is no cellulitis.  Pulmonary/Chest: Effort normal and breath sounds normal.  Musculoskeletal: Normal range of motion. She exhibits no edema.  Neurological: She is alert and oriented to person, place, and time.  Skin: Skin is warm and dry. She is not diaphoretic.  Psychiatric: She has a normal mood and affect. Her behavior is normal. Judgment and thought content normal.  Vitals reviewed.  BP 130/78 (BP Location: Left Arm)   Pulse (!) 55   Resp 16   Wt 162 lb (73.5 kg)   BMI 31.64 kg/m   Past Medical History:  Diagnosis Date  . Glaucoma   . Hyperlipidemia   . Hypertension   . Peripheral vascular disease (HCC)    Social History   Socioeconomic History  . Marital status: Widowed  Spouse name: Not on file  . Number of children: Not on file  . Years of education: Not on file  . Highest education level: Not on file  Social Needs  . Financial resource strain: Not on file  . Food insecurity - worry: Not on file  . Food insecurity - inability: Not on file  . Transportation needs - medical: Not on file  . Transportation needs - non-medical: Not on file  Occupational History  . Not on file  Tobacco Use  . Smoking status: Never Smoker  . Smokeless tobacco: Never Used  Substance and Sexual Activity  . Alcohol use: No  . Drug use: Not on file  . Sexual  activity: Not on file  Other Topics Concern  . Not on file  Social History Narrative  . Not on file   Past Surgical History:  Procedure Laterality Date  . EYE SURGERY     Family History  Problem Relation Age of Onset  . Varicose Veins Mother   . Deep vein thrombosis Mother   . Varicose Veins Father   . Varicose Veins Daughter   . Varicose Veins Son    Allergies  Allergen Reactions  . Other     Other reaction(s): Unknown Vitamins  . Sulfa Antibiotics Rash      Assessment & Plan:  Patient presents to review vascular studies.  The patient was last seen approximately 1 month ago for evaluation of left lower extremity phlebitis.  Since her last visit, the patient has been taking a daily baby aspirin, wearing compression stockings, elevating her legs and using warm compresses to the back of her calf.  The patient notes the "knot" located on the back of her left calf has progressively gotten smaller and less tender.  The patient denies any shortness of breath or chest pain.  The patient underwent a bilateral lower extremity venous reflux exam which was notable for venous incompetence in the right great saphenous, left anterior accessory saphenous and bilateral common femoral veins.  Partially occlusive thrombus noted from near the left saphenopopliteal junction to the proximal calf.  The left proximal to mid calf as well as an adjacent bundle varicose veins in the mid calf, the left small saphenous vein show occlusive thrombus with mixed echogenicity.  No superficial vein thrombosis in the right lower extremity.  No evidence of deep vein thrombosis in the bilateral lower extremities.  The patient denies any fever, nausea vomiting.  1. Varicose veins of bilateral lower extremities with other complications - Stable Since the patient's last visit, she has been wearing medical grade 1 compression stockings and elevating her legs. She notes a improvement in the overall feeling of her legs and  presents asymptomatic at this time. At this time, the patient is not interested in moving forward with any laser ablation or sclerotherapy to her veins. She should continue to engage in conservative therapy.  2. Phlebitis and thombophlb of superfic vessels of l low extremity - Improved Patient presents today with improved symptoms and physical exam Patient to continue taking baby aspirin, wearing compression stockings, engaging in elevation and using warm compresses to the back of the left calf. I will have the patient return in three months and undergo a left lower extremity venous duplex to assess the status of her left SVT The patient is to call the office if she notices any worsening of her symptoms.  - VAS US LOWER EXTREMITY VENOUS (DVT); Future  Current Outpatient Medications on File Prior to  Visit  Medication Sig Dispense Refill  . aspirin EC 81 MG tablet Take by mouth.    . Biotin 1000 MCG tablet Take by mouth.    . Cholecalciferol (VITAMIN D3) 2000 units capsule Take by mouth.    . fluticasone (FLONASE) 50 MCG/ACT nasal spray Place into the nose.    . magnesium oxide (MAG-OX) 400 MG tablet TAKE 1 TABLET (400 MG TOTAL) BY MOUTH ONCE DAILY.  11  . meloxicam (MOBIC) 7.5 MG tablet TAKE 1 TABLET (7.5 MG TOTAL) BY MOUTH ONCE DAILY.    . metoprolol succinate (TOPROL-XL) 25 MG 24 hr tablet Take by mouth.    . Multiple Vitamin (MULTI-VITAMINS) TABS Take by mouth.    . Omega-3 Fatty Acids (FISH OIL PO) Take by mouth.    . polyethylene glycol (MIRALAX / GLYCOLAX) packet Take by mouth.    . vitamin B-12 (CYANOCOBALAMIN) 1000 MCG tablet Take by mouth.     No current facility-administered medications on file prior to visit.    There are no Patient Instructions on file for this visit. No Follow-up on file.  Kewana Sanon A Zimal Weisensel, PA-C

## 2017-09-13 ENCOUNTER — Ambulatory Visit (INDEPENDENT_AMBULATORY_CARE_PROVIDER_SITE_OTHER): Payer: Medicare Other | Admitting: Vascular Surgery

## 2017-09-13 ENCOUNTER — Encounter (INDEPENDENT_AMBULATORY_CARE_PROVIDER_SITE_OTHER): Payer: Medicare Other

## 2019-01-16 IMAGING — US US EXTREM LOW VENOUS*L*
1 series · 13 of 24 positions shown · non-contrast
Comparison: None.

CLINICAL DATA: Initial evaluation for acute posterior calf
tightness/mass.



[Series 1: us extrem low venous*left* · 0.06mm/px · 13 of 64 slices shown]
[im 1/64]
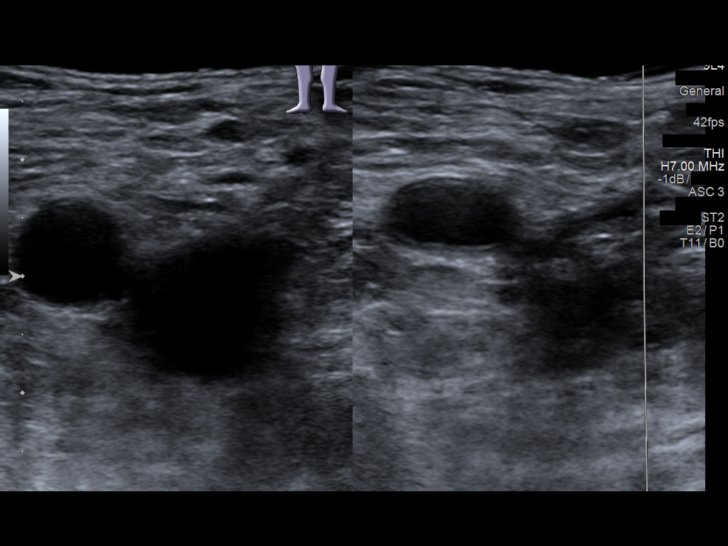
[im 6/64]
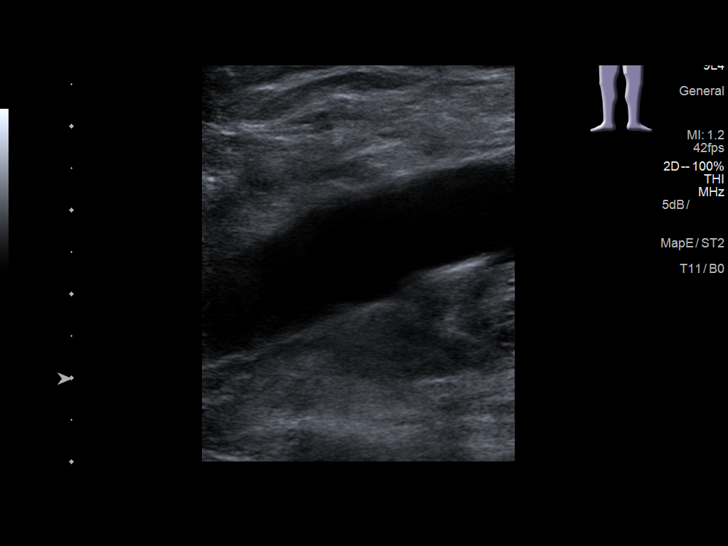
[im 11/64]
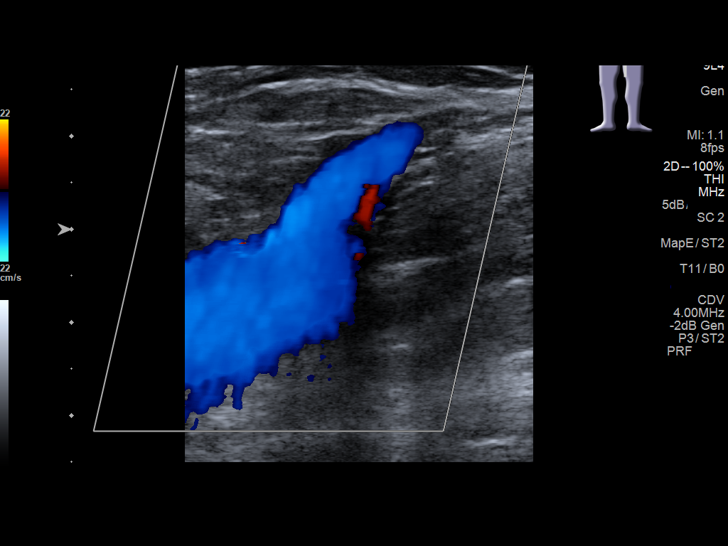
[im 17/64]
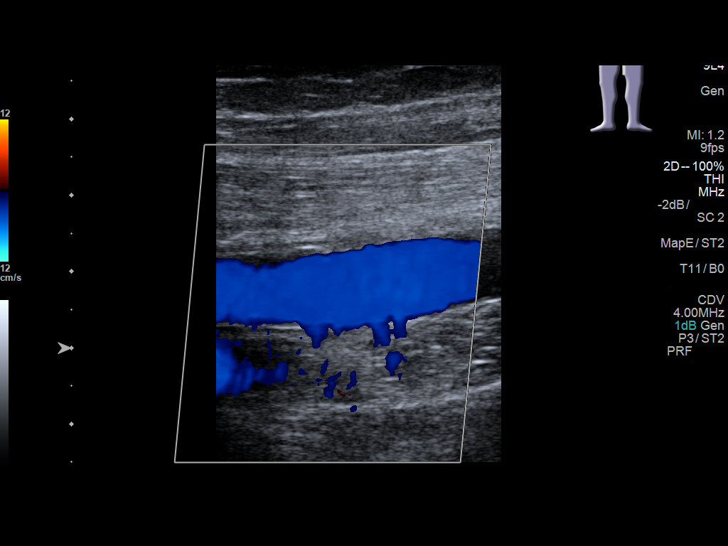
[im 22/64]
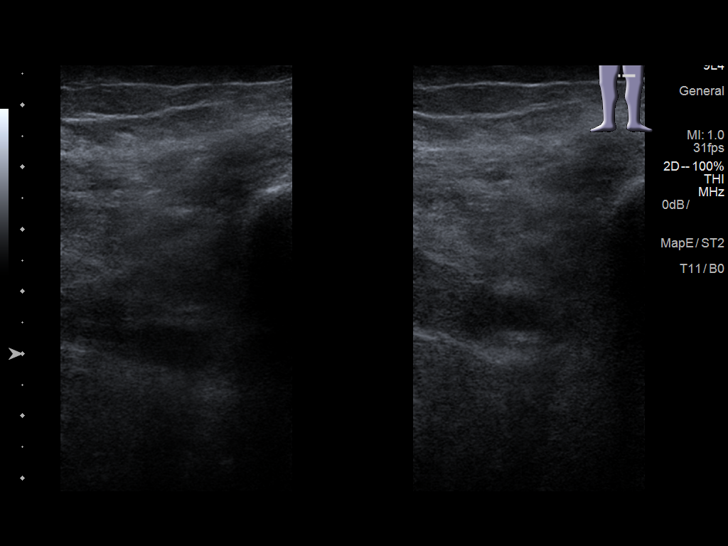
[im 28/64]
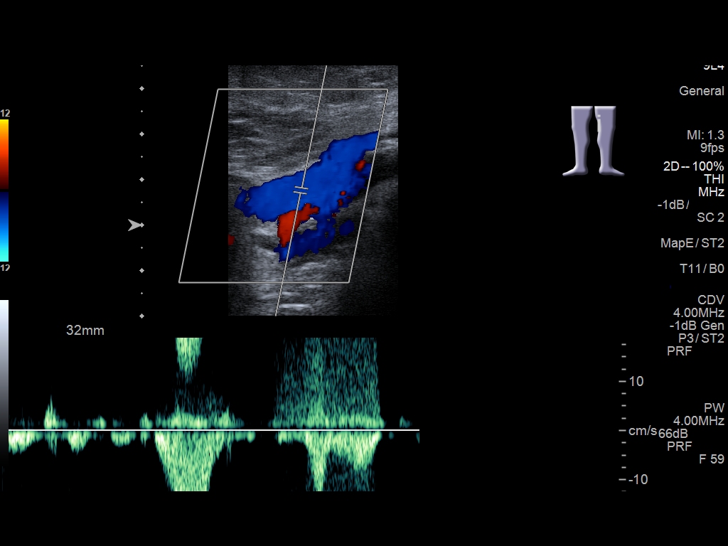
[im 33/64]
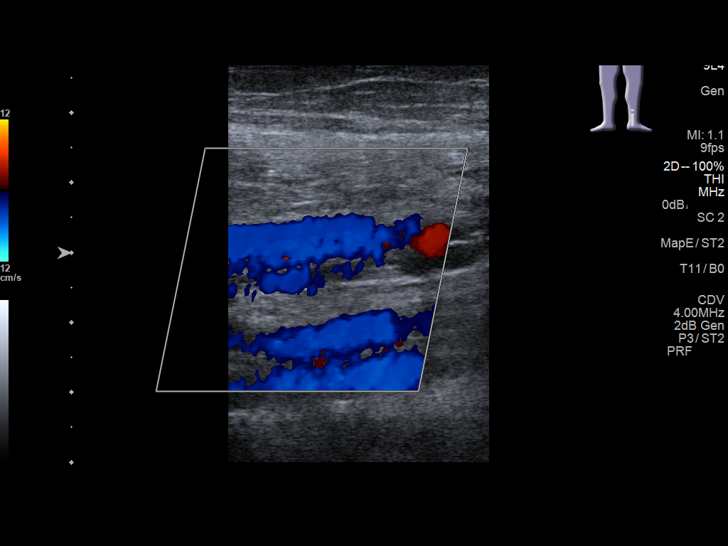
[im 36/64]
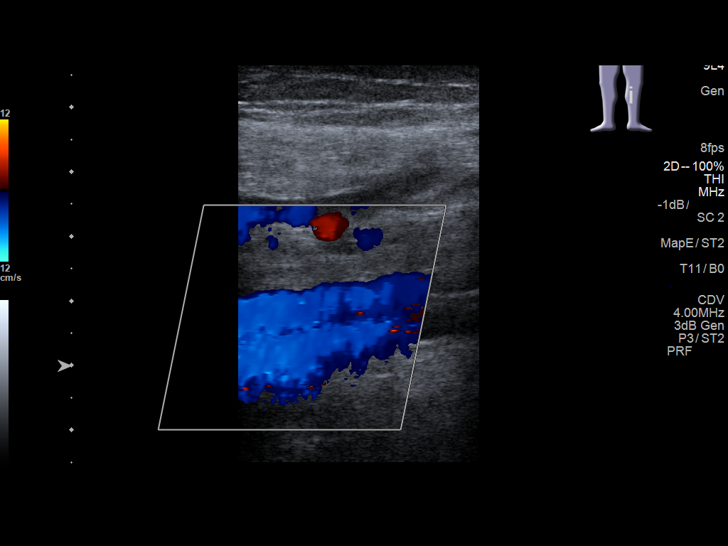
[im 42/64]
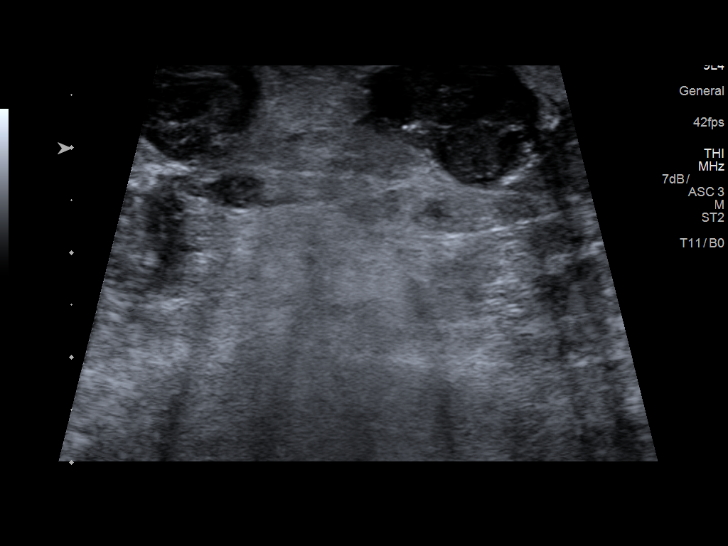
[im 47/64]
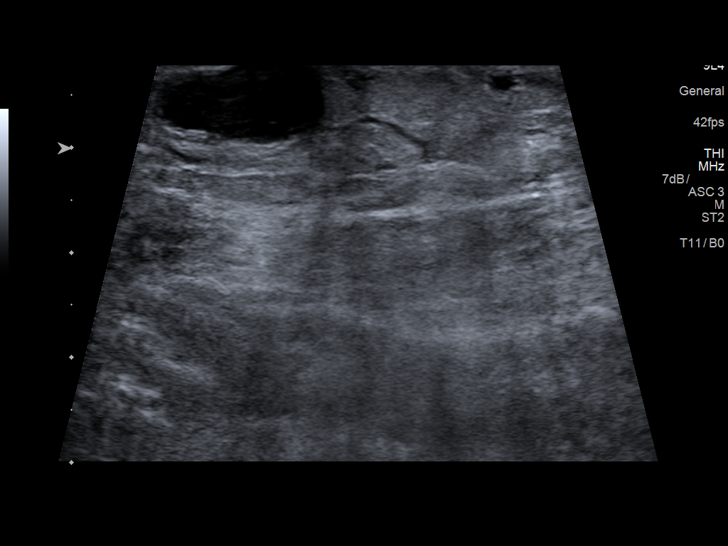
[im 53/64]
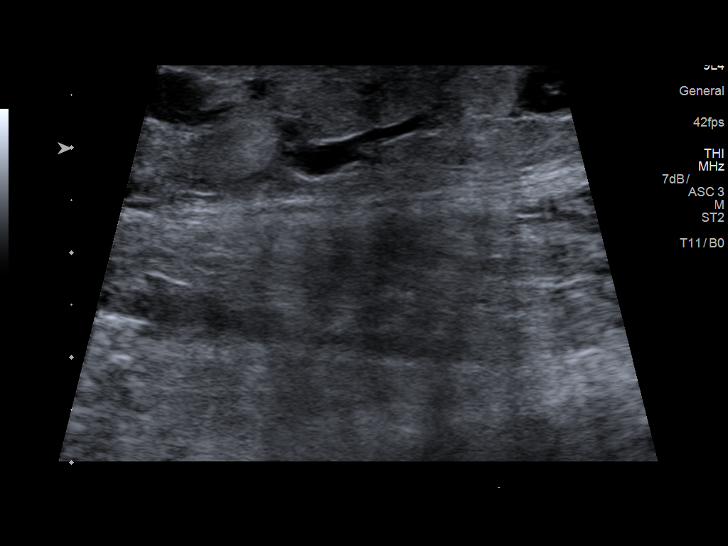
[im 58/64]
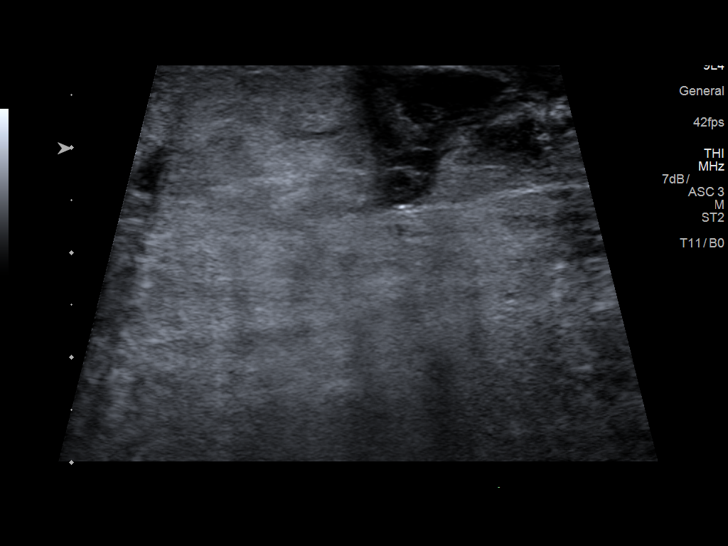
[im 64/64]
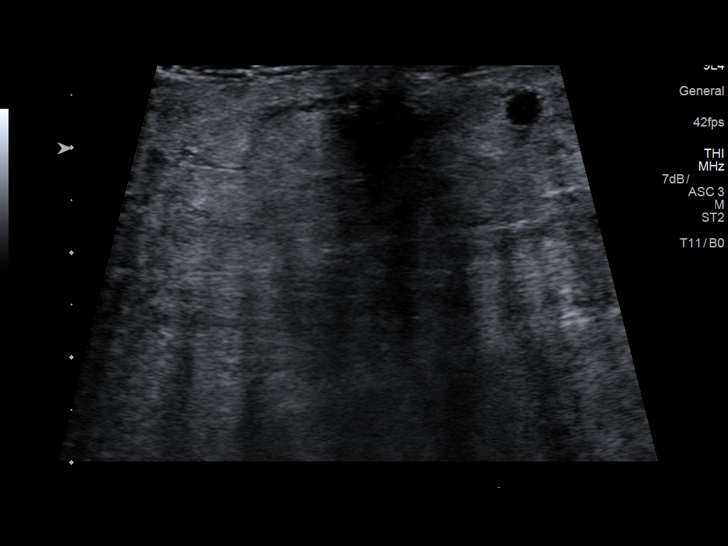

[13 of 24 positions shown; findings below may reference images not displayed]

FINDINGS: Contralateral Common Femoral Vein: Respiratory phasicity is normal
and symmetric with the symptomatic side. No evidence of thrombus.
Normal compressibility.

Common Femoral Vein: No evidence of thrombus. Normal
compressibility, respiratory phasicity and response to augmentation.

Saphenofemoral Junction: No evidence of thrombus. Normal
compressibility and flow on color Doppler imaging.

Profunda Femoral Vein: No evidence of thrombus. Normal
compressibility and flow on color Doppler imaging.

Femoral Vein: No evidence of thrombus. Normal compressibility,
respiratory phasicity and response to augmentation.

Popliteal Vein: No evidence of thrombus. Normal compressibility,
respiratory phasicity and response to augmentation.

Calf Veins: No evidence of thrombus. Normal compressibility and flow
on color Doppler imaging.

Superficial Great Saphenous Vein: No evidence of thrombus. Normal
compressibility and flow on color Doppler imaging.

Venous Reflux:  None.

Other Findings: Targeted ultrasound of the left posterior calf at
palpable area of concern demonstrates thrombus within multiple
enlarged prominent superficial varicose veins.
IMPRESSION: 1. No evidence of DVT within the left lower extremity.
2. Thrombosed superficial varicose veins at the palpable area
concern at the left posterior calf.

## 2022-09-11 ENCOUNTER — Other Ambulatory Visit: Payer: Self-pay | Admitting: Internal Medicine

## 2022-09-11 DIAGNOSIS — R4182 Altered mental status, unspecified: Secondary | ICD-10-CM

## 2022-09-11 DIAGNOSIS — I482 Chronic atrial fibrillation, unspecified: Secondary | ICD-10-CM

## 2022-09-21 ENCOUNTER — Ambulatory Visit
Admission: RE | Admit: 2022-09-21 | Discharge: 2022-09-21 | Disposition: A | Discharge status: Home / Self Care | Payer: Medicare PPO | Source: Ambulatory Visit | Attending: Internal Medicine | Admitting: Internal Medicine

## 2022-09-21 DIAGNOSIS — R4182 Altered mental status, unspecified: Secondary | ICD-10-CM | POA: Diagnosis present

## 2022-09-21 DIAGNOSIS — I482 Chronic atrial fibrillation, unspecified: Secondary | ICD-10-CM | POA: Insufficient documentation

## 2023-08-13 ENCOUNTER — Emergency Department

## 2023-08-13 ENCOUNTER — Emergency Department
Admission: EM | Admit: 2023-08-13 | Discharge: 2023-08-13 | Disposition: A | Discharge status: Home / Self Care | Attending: Emergency Medicine | Admitting: Emergency Medicine

## 2023-08-13 ENCOUNTER — Other Ambulatory Visit: Payer: Self-pay

## 2023-08-13 ENCOUNTER — Encounter: Payer: Self-pay | Admitting: Emergency Medicine

## 2023-08-13 DIAGNOSIS — S0990XA Unspecified injury of head, initial encounter: Secondary | ICD-10-CM | POA: Diagnosis present

## 2023-08-13 DIAGNOSIS — W19XXXA Unspecified fall, initial encounter: Secondary | ICD-10-CM | POA: Diagnosis not present

## 2023-08-13 DIAGNOSIS — I10 Essential (primary) hypertension: Secondary | ICD-10-CM | POA: Insufficient documentation

## 2023-08-13 DIAGNOSIS — S0101XA Laceration without foreign body of scalp, initial encounter: Secondary | ICD-10-CM | POA: Insufficient documentation

## 2023-08-13 NOTE — ED Provider Notes (Signed)
[facility] Provider Note    Event Date/Time   First MD Initiated Contact with Patient 08/13/23 1004     (approximate)   History   Fall and Head Laceration   HPI  Amy [name] is a 89 y.o. female with a history of hypertension, PVD who presents after a fall.  Patient reports she lost her balance, fell backwards.  She was able to get up and hit her med alert.  Reports bleeding from the back of her head, otherwise has no complaints.  Denies back pain, no abdominal pain, no chest pain, no extremity injuries.     Physical Exam   Triage Vital Signs: ED Triage Vitals  Encounter Vitals Group     BP 08/13/23 0928 (!) 141/91     Systolic BP Percentile --      Diastolic BP Percentile --      Pulse Rate 08/13/23 0928 71     Resp 08/13/23 0928 15     Temp 08/13/23 0928 97.6 F (36.4 C)     Temp Source 08/13/23 0928 Oral     SpO2 08/13/23 0928 100 %     Weight 08/13/23 0933 59 kg (130 lb)     Height 08/13/23 0933 1.575 m (5\' 2" )     Head Circumference --      Peak Flow --      Pain Score 08/13/23 0932 0     Pain Loc --      Pain Education --      Exclude from Growth Chart --     Most recent vital signs: Vitals:   08/13/23 0928  BP: (!) 141/91  Pulse: 71  Resp: 15  Temp: 97.6 F (36.4 C)  SpO2: 100%     General: Awake, no distress.  CV:  Good peripheral perfusion.  No chest wall tenderness Resp:  Normal effort.  Abd:  No distention.  No abdominal tenderness Other:  Normal range of motion of all extremities, no pain in the lower extremities with axial load.  No vertebral tenderness to palpation.  Reassuring exam.  3 cm vertical laceration, bleeding controlled to the posterior scalp   ED Results / Procedures / Treatments   Labs (all labs ordered are listed, but only abnormal results are displayed) Labs Reviewed - No data to display   EKG     RADIOLOGY CT head viewed interpret by me, no evidence of bleeding or skull  fracture    PROCEDURES:  Critical Care performed:   .Laceration Repair  Date/Time: 08/13/2023 12:37 PM  Performed by: Jene Every, MD Authorized by: Jene Every, MD   Consent:    Consent obtained:  Verbal   Consent given by:  Patient   Risks discussed:  Infection and pain Laceration details:    Location:  Scalp   Scalp location:  Crown   Length (cm):  3 Exploration:    Contaminated: no   Treatment:    Area cleansed with:  Chlorhexidine   Amount of cleaning:  Standard Skin repair:    Repair method:  Staples   Number of staples:  4 Approximation:    Approximation:  Close Repair type:    Repair type:  Simple Post-procedure details:    Dressing:  Bulky dressing   Procedure completion:  Tolerated well, no immediate complications    MEDICATIONS ORDERED IN ED: Medications - No data to display   IMPRESSION / MDM / ASSESSMENT AND PLAN / ED COURSE  I reviewed the triage  vital signs and the nursing notes. Patient's presentation is most consistent with acute presentation with potential threat to life or bodily function.  Patient presents after fall with head injury.  This appears to be a mechanical fall.  She is on Eliquis.  Neuro intact, laceration to the posterior scalp.  Will obtain CT head, CT cervical spine  CT imaging is reassuring  Laceration repaired by me     FINAL CLINICAL IMPRESSION(S) / ED DIAGNOSES   Final diagnoses:  Fall, initial encounter  Injury of head, initial encounter  Laceration of scalp, initial encounter     Rx / DC Orders   ED Discharge Orders     None        Note:  This document was prepared using Dragon voice recognition software and may include unintentional dictation errors.   Jene Every, MD 08/13/23 [phone]

## 2023-08-13 NOTE — ED Triage Notes (Signed)
Pt in via [facility] from home due to mechanical fall, striking posterior head w/ laceration.  Denies LOC, does take Eliquis.    Denies any other complaints, A/Ox3; per daughter this is patients baseline.

## 2023-08-19 ENCOUNTER — Other Ambulatory Visit: Payer: Self-pay

## 2023-08-19 ENCOUNTER — Emergency Department: Payer: Medicare PPO

## 2023-08-19 DIAGNOSIS — R0602 Shortness of breath: Secondary | ICD-10-CM | POA: Diagnosis present

## 2023-08-19 DIAGNOSIS — Z6823 Body mass index (BMI) 23.0-23.9, adult: Secondary | ICD-10-CM | POA: Insufficient documentation

## 2023-08-19 DIAGNOSIS — R2689 Other abnormalities of gait and mobility: Secondary | ICD-10-CM | POA: Insufficient documentation

## 2023-08-19 DIAGNOSIS — R4182 Altered mental status, unspecified: Secondary | ICD-10-CM | POA: Insufficient documentation

## 2023-08-19 DIAGNOSIS — R1310 Dysphagia, unspecified: Secondary | ICD-10-CM | POA: Insufficient documentation

## 2023-08-19 DIAGNOSIS — R531 Weakness: Principal | ICD-10-CM | POA: Insufficient documentation

## 2023-08-19 DIAGNOSIS — Z7982 Long term (current) use of aspirin: Secondary | ICD-10-CM | POA: Diagnosis not present

## 2023-08-19 DIAGNOSIS — B348 Other viral infections of unspecified site: Secondary | ICD-10-CM | POA: Diagnosis not present

## 2023-08-19 DIAGNOSIS — E43 Unspecified severe protein-calorie malnutrition: Secondary | ICD-10-CM | POA: Insufficient documentation

## 2023-08-19 DIAGNOSIS — Z20822 Contact with and (suspected) exposure to covid-19: Secondary | ICD-10-CM | POA: Insufficient documentation

## 2023-08-19 DIAGNOSIS — I482 Chronic atrial fibrillation, unspecified: Secondary | ICD-10-CM | POA: Diagnosis not present

## 2023-08-19 DIAGNOSIS — Z7901 Long term (current) use of anticoagulants: Secondary | ICD-10-CM | POA: Diagnosis not present

## 2023-08-19 DIAGNOSIS — R778 Other specified abnormalities of plasma proteins: Secondary | ICD-10-CM | POA: Diagnosis not present

## 2023-08-19 DIAGNOSIS — G9340 Encephalopathy, unspecified: Secondary | ICD-10-CM | POA: Diagnosis not present

## 2023-08-19 DIAGNOSIS — E785 Hyperlipidemia, unspecified: Secondary | ICD-10-CM | POA: Insufficient documentation

## 2023-08-19 DIAGNOSIS — I429 Cardiomyopathy, unspecified: Secondary | ICD-10-CM | POA: Diagnosis not present

## 2023-08-19 DIAGNOSIS — I10 Essential (primary) hypertension: Secondary | ICD-10-CM | POA: Insufficient documentation

## 2023-08-19 DIAGNOSIS — E860 Dehydration: Secondary | ICD-10-CM | POA: Insufficient documentation

## 2023-08-19 LAB — RESP PANEL BY RT-PCR (RSV, FLU A&B, COVID)  RVPGX2
Influenza A by PCR: NEGATIVE
Influenza B by PCR: NEGATIVE
Resp Syncytial Virus by PCR: POSITIVE — AB
SARS Coronavirus 2 by RT PCR: NEGATIVE

## 2023-08-19 NOTE — ED Triage Notes (Signed)
First Nurse Note;  Pt [facility] from home. Per daughter, around 51 with had some altered mental status and increased generalized weakness. Pt is alert but confused.  140/68 BP  50-120 afib with hx of same  122 CBG

## 2023-08-19 NOTE — ED Triage Notes (Addendum)
Pts daughter reports the patient has had worsening confusion since yesterday. She has dementia but the confusion is worse yesterday and today. She did have a fall last week and hit her head and had 4 staples place in the back of her head. She was seen here and had a head CT which resulted normal. She takes Eliquis. Denies headache.   Daughter also reports the patient has a cough

## 2023-08-20 ENCOUNTER — Emergency Department: Payer: Medicare PPO

## 2023-08-20 ENCOUNTER — Other Ambulatory Visit: Payer: Self-pay

## 2023-08-20 ENCOUNTER — Observation Stay
Admission: EM | Admit: 2023-08-20 | Discharge: 2023-08-29 | Disposition: A | Discharge status: Home / Self Care | Payer: Medicare PPO | Attending: Internal Medicine | Admitting: Internal Medicine

## 2023-08-20 DIAGNOSIS — E860 Dehydration: Secondary | ICD-10-CM

## 2023-08-20 DIAGNOSIS — R531 Weakness: Secondary | ICD-10-CM | POA: Diagnosis not present

## 2023-08-20 DIAGNOSIS — R4182 Altered mental status, unspecified: Principal | ICD-10-CM

## 2023-08-20 DIAGNOSIS — E43 Unspecified severe protein-calorie malnutrition: Secondary | ICD-10-CM | POA: Insufficient documentation

## 2023-08-20 DIAGNOSIS — B338 Other specified viral diseases: Secondary | ICD-10-CM

## 2023-08-20 LAB — COMPREHENSIVE METABOLIC PANEL
ALT: 22 U/L (ref 0–44)
AST: 27 U/L (ref 15–41)
Albumin: 3.3 g/dL — ABNORMAL LOW (ref 3.5–5.0)
Alkaline Phosphatase: 75 U/L (ref 38–126)
Anion gap: 9 (ref 5–15)
BUN: 30 mg/dL — ABNORMAL HIGH (ref 8–23)
CO2: 22 mmol/L (ref 22–32)
Calcium: 9.6 mg/dL (ref 8.9–10.3)
Chloride: 107 mmol/L (ref 98–111)
Creatinine, Ser: 0.62 mg/dL (ref 0.44–1.00)
GFR, Estimated: >60 mL/min (ref >60)
Glucose, Bld: 93 mg/dL (ref 70–99)
Potassium: 3.8 mmol/L (ref 3.5–5.1)
Sodium: 138 mmol/L (ref 135–145)
Total Bilirubin: 0.7 mg/dL (ref 0.0–1.2)
Total Protein: 5.9 g/dL — ABNORMAL LOW (ref 6.5–8.1)

## 2023-08-20 LAB — TROPONIN I (HIGH SENSITIVITY)
Troponin I (High Sensitivity): 18 ng/L — ABNORMAL HIGH (ref <18)
Troponin I (High Sensitivity): 16 ng/L (ref <18)
Troponin I (High Sensitivity): 14 ng/L (ref <18)

## 2023-08-20 LAB — URINALYSIS, ROUTINE W REFLEX MICROSCOPIC
Bilirubin Urine: NEGATIVE
Glucose, UA: 50 mg/dL — AB
Hgb urine dipstick: NEGATIVE
Ketones, ur: NEGATIVE mg/dL
Leukocytes,Ua: NEGATIVE
Nitrite: NEGATIVE
Protein, ur: NEGATIVE mg/dL
Specific Gravity, Urine: 1.019 (ref 1.005–1.030)
pH: 5 (ref 5.0–8.0)

## 2023-08-20 LAB — CBC
HCT: 36 % (ref 36.0–46.0)
Hemoglobin: 12.1 g/dL (ref 12.0–15.0)
MCH: 32.8 pg (ref 26.0–34.0)
MCHC: 33.6 g/dL (ref 30.0–36.0)
MCV: 97.6 fL (ref 80.0–100.0)
Platelets: 260 10*3/uL (ref 150–400)
RBC: 3.69 MIL/uL — ABNORMAL LOW (ref 3.87–5.11)
RDW: 13.2 % (ref 11.5–15.5)
WBC: 6.3 10*3/uL (ref 4.0–10.5)
nRBC: 0 % (ref 0.0–0.2)

## 2023-08-20 LAB — LACTIC ACID, PLASMA
Lactic Acid, Venous: 0.7 mmol/L (ref 0.5–1.9)
Lactic Acid, Venous: 0.8 mmol/L (ref 0.5–1.9)

## 2023-08-20 MED ORDER — SODIUM CHLORIDE 0.9 % IV SOLN: INTRAVENOUS | Status: DC

## 2023-08-20 MED ORDER — VITAMIN B-12 100 MCG PO TABS
100.0000 ug | ORAL_TABLET | Freq: Every day | ORAL | Status: DC
  Administered 2023-08-21 – 2023-08-29 (×9): 100 ug via ORAL
  Filled 2023-08-20 (×13): qty 1

## 2023-08-20 MED ORDER — LISINOPRIL 5 MG PO TABS
2.5000 mg | ORAL_TABLET | Freq: Every day | ORAL | Status: DC
  Administered 2023-08-20 – 2023-08-28 (×9): 2.5 mg via ORAL
  Filled 2023-08-20 (×9): qty 1

## 2023-08-20 MED ORDER — ONDANSETRON HCL 4 MG/2ML IJ SOLN: 4.0000 mg | Freq: Four times a day (QID) | INTRAMUSCULAR | Status: DC | PRN

## 2023-08-20 MED ORDER — ACETAMINOPHEN 650 MG RE SUPP: 650.0000 mg | Freq: Four times a day (QID) | RECTAL | Status: DC | PRN

## 2023-08-20 MED ORDER — APIXABAN 2.5 MG PO TABS
2.5000 mg | ORAL_TABLET | Freq: Two times a day (BID) | ORAL | Status: DC
  Administered 2023-08-20 – 2023-08-29 (×18): 2.5 mg via ORAL
  Filled 2023-08-20 (×21): qty 1

## 2023-08-20 MED ORDER — SODIUM CHLORIDE 0.9 % IV BOLUS
1000.0000 mL | Freq: Once | INTRAVENOUS | Status: AC
  Administered 2023-08-20: 1000 mL via INTRAVENOUS

## 2023-08-20 MED ORDER — ACETAMINOPHEN 325 MG PO TABS
650.0000 mg | ORAL_TABLET | Freq: Four times a day (QID) | ORAL | Status: DC | PRN
Start: 2023-08-20 — End: 2023-08-29
  Administered 2023-08-22 – 2023-08-28 (×2): 650 mg via ORAL
  Filled 2023-08-20 (×2): qty 2

## 2023-08-20 MED ORDER — ONDANSETRON HCL 4 MG PO TABS: 4.0000 mg | ORAL_TABLET | Freq: Four times a day (QID) | ORAL | Status: DC | PRN

## 2023-08-20 MED ORDER — NITROFURANTOIN MACROCRYSTAL 50 MG PO CAPS
50.0000 mg | ORAL_CAPSULE | Freq: Every day | ORAL | Status: DC
Start: 2023-08-21 — End: 2023-08-22
  Administered 2023-08-21 – 2023-08-22 (×2): 50 mg via ORAL
  Filled 2023-08-20 (×2): qty 1

## 2023-08-20 MED ORDER — SODIUM CHLORIDE 0.9% FLUSH
3.0000 mL | Freq: Two times a day (BID) | INTRAVENOUS | Status: DC
  Administered 2023-08-20 – 2023-08-29 (×19): 3 mL via INTRAVENOUS

## 2023-08-20 NOTE — Evaluation (Signed)
Physical Therapy Evaluation Patient Details Name: Amy [name] MRN: [id number] DOB: Dec 09, 1926 Today's Date: 08/20/2023  History of Present Illness  Pt is a 89 y.o. female brought to the ED from home by her daughter with a chief complaint of cough, altered mental status and generalized weakness with recent fall history.  MD assessment includes: altered mental status/underlying dementia/acute RSV infection, protein calorie malnutrition, difficulty swallowing, and slight elevation of troponin due to demand ischemia. 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   Clinical Impression  Pt was pleasant and motivated to participate during the session and put forth good effort throughout. Pt required significant time and effort along with min to mod physical assistance and cuing for sequencing during below functional mobility assessment.  Pt was somewhat unsteady in standing most notably during limited ambulation requiring physical assistance to prevent LOB.  Pt reported no adverse symptoms during the session with SpO2 and HR WNL on room air.  Pt is at an elevated risk for falls and will benefit from continued PT services upon discharge to safely address deficits listed in patient problem list for decreased caregiver assistance and eventual return to PLOF.          If plan is discharge home, recommend the following: Two people to help with walking and/or transfers;A lot of help with bathing/dressing/bathroom;Assistance with cooking/housework;Direct supervision/assist for medications management;Assist for transportation;Help with stairs or ramp for entrance;Direct supervision/assist for financial management;Supervision due to cognitive status   Can travel by private vehicle   No    Equipment Recommendations Other (comment) (TBD at next venue of care)  Recommendations for Other Services       Functional Status Assessment Patient has had a  recent decline in their functional status and demonstrates the ability to make significant improvements in function in a reasonable and predictable amount of time.     Precautions / Restrictions Precautions Precautions: Fall Restrictions Weight Bearing Restrictions Per Provider Order: No      Mobility  Bed Mobility Overal bed mobility: Needs Assistance Bed Mobility: Supine to Sit, Sit to Supine     Supine to sit: Min assist Sit to supine: Mod assist   General bed mobility comments: Assist for BLE and trunk control    Transfers Overall transfer level: Needs assistance Equipment used: Rolling walker (2 wheels) Transfers: Sit to/from Stand Sit to Stand: Min assist, From elevated surface           General transfer comment: Min A to come to standing and for initial stabiltiy    Ambulation/Gait Ambulation/Gait assistance: Mod assist Gait Distance (Feet): 2 Feet Assistive device: Rolling walker (2 wheels) Gait Pattern/deviations: Step-to pattern, Decreased step length - right, Decreased step length - left, Trunk flexed, Shuffle Gait velocity: decreased     General Gait Details: Pt able to take several small, shuffling steps near the EOB with Mod A for stability and to manage the RW before fatiguing and needing to return to sitting  Stairs            Wheelchair Mobility     Tilt Bed    Modified Rankin (Stroke Patients Only)       Balance Overall balance assessment: Needs assistance   Sitting balance-Leahy Scale: Fair Sitting balance - Comments: CGA/SBA for seated balance at EOB   Standing balance support: Bilateral upper extremity supported, Reliant on assistive device for balance, During functional activity Standing balance-Leahy Scale: Poor  Pertinent Vitals/Pain Pain Assessment Pain Assessment: No/denies pain    Home Living Family/patient expects to be discharged to:: Private residence Living  Arrangements: Alone;Other (Comment) (Alone at night) Available Help at Discharge: Family;Available PRN/intermittently Type of Home: House Home Access: Stairs to enter Entrance Stairs-Rails: Left Entrance Stairs-Number of Steps: 2; extended rail and assist from daughter to get up steps   Home Layout: Two level;Able to live on main level with bedroom/bathroom Home Equipment: Rolling Walker (2 wheels);Rollator (4 wheels);Grab bars - toilet Additional Comments: History from daughter secondary to pt being a poor historian.  Daughter stays with pt from 930am-430 pm; sons come in about 5pm-8pm, pt alone at night. Pt [facility] receiving HH [facility] the [facility] weeks    Prior Function Prior Level of Function : History of Falls (last six months)             Mobility Comments: Children assist with car transfers and steps, 2 recent falls ADLs Comments: pt is able to wash dishes, go to the bathroom, can sponge bathe, PRN assist from family     Extremity/Trunk Assessment   Upper Extremity Assessment Upper Extremity Assessment: Generalized weakness    Lower Extremity Assessment Lower Extremity Assessment: Generalized weakness       Communication   Communication Communication: Difficulty following commands/understanding;Difficulty communicating thoughts/reduced clarity of speech Following commands: Follows one step commands with increased time Cueing Techniques: Verbal cues;Tactile cues;Visual cues  Cognition Arousal: Alert Behavior During Therapy: WFL for tasks assessed/performed Overall Cognitive Status: No family/caregiver present to determine baseline cognitive functioning                                 General Comments: Oriented to person and place, not date        General Comments General comments (skin integrity, edema, etc.): VSS throughout eval    Exercises     Assessment/Plan    PT Assessment Patient needs continued PT services   PT Problem List Decreased strength;Decreased activity tolerance;Decreased balance;Decreased mobility;Decreased knowledge of use of DME       PT Treatment Interventions DME instruction;Gait training;Stair training;Functional mobility training;Therapeutic activities;Therapeutic exercise;Balance training;Patient/family education    PT Goals (Current goals can be found in the Care Plan section)  Acute Rehab PT Goals Patient Stated Goal: Improved strength and balance PT Goal Formulation: With patient Time For Goal Achievement: 09/02/23 Potential to Achieve Goals: Good    Frequency Min 1X/week     Co-evaluation               AM-PAC PT "6 Clicks" Mobility  Outcome Measure Help needed turning from your back to your side while in a flat bed without using bedrails?: A Little Help needed moving from lying on your back to sitting on the side of a flat bed without using bedrails?: A Lot Help needed moving to and from a bed to a chair (including a wheelchair)?: A Lot Help needed standing up from a chair using your arms (e.g., wheelchair or bedside chair)?: A Lot Help needed to walk in hospital room?: Total Help needed climbing 3-5 steps with a railing? : Total 6 Click Score: 11    End of Session Equipment Utilized During Treatment: Gait belt Activity Tolerance: Patient tolerated treatment well Patient left: in bed;with call bell/phone within reach;Other (comment);with family/visitor present (Bilateral ED bed rails up as pt found) Nurse Communication: Mobility status PT Visit Diagnosis: Unsteadiness  on feet (R26.81);History of falling (Z91.81);Difficulty in walking, not elsewhere classified (R26.2);Muscle weakness (generalized) (M62.81)    Time: 1191-4782 PT Time Calculation (min) (ACUTE ONLY): 25 min   Charges:   PT Evaluation $PT Eval Moderate Complexity: 1 Mod   PT General Charges $$ ACUTE PT VISIT: 1 Visit    D. Elly Modena PT, DPT 08/20/23, 5:17 PM

## 2023-08-20 NOTE — ED Notes (Signed)
PT and OT were in to eval pt  Pt is able to stand  and take po w/o diff

## 2023-08-20 NOTE — ED Notes (Signed)
Patient's daughter came to the desk and requested to be called if there are any changes (the patient gets moved or otherwise). The daughter needed to run home for a bit.

## 2023-08-20 NOTE — ED Notes (Signed)
Pt was incon of urine  Pt cleaned up and new brief applied  Daughter at bedside

## 2023-08-20 NOTE — ED Provider Notes (Signed)
[facility] Provider Note    Event Date/Time   First MD Initiated Contact with Patient 08/20/23 0134     (approximate)   History   Altered Mental Status   HPI  History obtained via patient's daughter  Amy [name] is a 89 y.o. female brought to the ED from home by her daughter with a chief complaint of cough, altered mental status and generalized weakness.  Patient had a fall last week and has 4 staples in place which should be removed.  On Eliquis.  Daughter noted cough 2 days ago with increasing weakness to the point she cannot stand today.  Patient with a history of dementia but with increasing confusion.  Denies fever, shortness of breath, abdominal pain, vomiting or diarrhea.     Past Medical History   Past Medical History:  Diagnosis Date   Glaucoma    Hyperlipidemia    Hypertension    Peripheral vascular disease [facility])      Active Problem List   Patient Active Problem List   Diagnosis Date Noted   Generalized weakness 08/20/2023   Phlebitis and thombophlb of superfic vessels of l low extrem 05/14/2017   Varicose veins of bilateral lower extremities with other complications 05/14/2017     Past Surgical History   Past Surgical History:  Procedure Laterality Date   EYE SURGERY       Home Medications   Prior to Admission medications   Medication Sig Start Date End Date Taking? Authorizing Provider  aspirin EC 81 MG tablet Take by mouth.    [provider]  Biotin 1000 MCG tablet Take by mouth.    [provider]  Cholecalciferol (VITAMIN D3) 2000 units capsule Take by mouth.    [provider]  fluticasone (FLONASE) 50 MCG/ACT nasal spray Place into the nose. 08/11/16 08/11/17  [provider]  magnesium oxide (MAG-OX) 400 MG tablet TAKE 1 TABLET (400 MG TOTAL) BY MOUTH ONCE DAILY. 05/10/17   [provider]  meloxicam (MOBIC) 7.5 MG tablet TAKE 1 TABLET (7.5 MG TOTAL) BY MOUTH ONCE  DAILY. 04/14/17   [provider]  metoprolol succinate (TOPROL-XL) 25 MG 24 hr tablet Take by mouth. 03/08/17 03/08/18  [provider]  Multiple Vitamin (MULTI-VITAMINS) TABS Take by mouth.    [provider]  Omega-3 Fatty Acids (FISH OIL PO) Take by mouth.    [provider]  polyethylene glycol (MIRALAX / GLYCOLAX) packet Take by mouth.    [provider]  vitamin B-12 (CYANOCOBALAMIN) 1000 MCG tablet Take by mouth.    [provider]     Allergies  Sulfa antibiotics   Family History   Family History  Problem Relation Age of Onset   Varicose Veins Mother    Deep vein thrombosis Mother    Varicose Veins Father    Varicose Veins Daughter    Varicose Veins Son      Physical Exam  Triage Vital Signs: ED Triage Vitals  Encounter Vitals Group     BP 08/19/23 1913 110/81     Systolic BP Percentile --      Diastolic BP Percentile --      Pulse Rate 08/19/23 1913 78     Resp 08/19/23 1913 20     Temp 08/19/23 1913 (!) 97.5 F (36.4 C)     Temp Source 08/19/23 1913 Oral     SpO2 08/19/23 1913 100 %     Weight 08/19/23 1927 130 lb (  59 kg)     Height 08/19/23 1927 5\' 2"  (1.575 m)     Head Circumference --      Peak Flow --      Pain Score 08/19/23 1927 0     Pain Loc --      Pain Education --      Exclude from Growth Chart --     Updated Vital Signs: BP 118/71 (BP Location: Right Arm)   Pulse (!) 110   Temp (!) 97.5 F (36.4 C) (Oral)   Resp 16   Ht 5\' 2"  (1.575 m)   Wt 59 kg   SpO2 95%   BMI 23.78 kg/m    General: Asleep, no distress.  CV:  Tachycardic.  Good peripheral perfusion.  Resp:  Normal effort.  Scattered rhonchi. Abd:  Nontender.  No distention.  Other:  Staples in place posterior head.   ED Results / Procedures / Treatments  Labs (all labs ordered are listed, but only abnormal results are displayed) Labs Reviewed  RESP PANEL BY RT-PCR (RSV, FLU A&B, COVID)  [id number] - Abnormal; Notable for  the following components:      Result Value   Resp Syncytial Virus by PCR POSITIVE (*)    All other components within normal limits  COMPREHENSIVE METABOLIC PANEL - Abnormal; Notable for the following components:   BUN 30 (*)    Total Protein 5.9 (*)    Albumin 3.3 (*)    All other components within normal limits  CBC - Abnormal; Notable for the following components:   RBC 3.69 (*)    All other components within normal limits  URINALYSIS, ROUTINE W REFLEX MICROSCOPIC - Abnormal; Notable for the following components:   Color, Urine YELLOW (*)    APPearance CLEAR (*)    Glucose, UA 50 (*)    All other components within normal limits  TROPONIN I (HIGH SENSITIVITY) - Abnormal; Notable for the following components:   Troponin I (High Sensitivity) 18 (*)    All other components within normal limits  CULTURE, BLOOD (ROUTINE X 2)  CULTURE, BLOOD (ROUTINE X 2)  URINE CULTURE  LACTIC ACID, PLASMA  LACTIC ACID, PLASMA  CBG MONITORING, ED     EKG  ED ECG REPORT I, Nasser Ku J, the attending physician, personally viewed and interpreted this ECG.   Date: 08/20/2023  EKG Time: 0221  Rate: 73  Rhythm: atrial fibrillation, rate 73  Axis: Normal  Intervals:none  ST&T Change: Nonspecific    RADIOLOGY I have independently visualized and interpreted patient's imaging studies as well as noted the radiology interpretation:  Chest x-ray: No active disease  CT head: No ICH  Official radiology report(s): DG Chest Port 1 View Result Date: 08/20/2023 CLINICAL DATA:  Shortness of breath EXAM: PORTABLE CHEST 1 VIEW COMPARISON:  07/15/2012 FINDINGS: Cardiac shadow is enlarged. Aortic calcifications are noted. The lungs are well aerated bilaterally. No focal infiltrate or effusion is seen. No bony abnormality is noted. IMPRESSION: No active disease. Electronically Signed   By: Alcide Clever M.D.   On: 08/20/2023 01:55   CT Head Wo Contrast Result Date: 08/19/2023 CLINICAL DATA:  Head trauma,  focal neuro findings (Age 73-64y) EXAM: CT HEAD WITHOUT CONTRAST TECHNIQUE: Contiguous axial images were obtained from the base of the skull through the vertex without intravenous contrast. RADIATION DOSE REDUCTION: This exam was performed according to the departmental dose-optimization program which includes automated exposure control, adjustment of the mA and/or kV according to patient size and/or use of  iterative reconstruction technique. COMPARISON:  CT head 08/13/2023. FINDINGS: Brain: No evidence of acute infarction, hemorrhage, hydrocephalus, extra-axial collection or mass lesion/mass effect. Patchy white matter hypodensities are nonspecific but compatible with chronic microvascular ischemic disease. Vascular: No hyperdense vessel identified. Calcific atherosclerosis. Skull: No acute fracture.  Posterior left scalp staples. Sinuses/Orbits: Mostly clear sinuses.  No acute orbital findings. Other: Mastoid effusions. IMPRESSION: 1. No evidence of acute intracranial abnormality. 2. Chronic microvascular ischemic disease. Electronically Signed   By: Feliberto Harts M.D.   On: 08/19/2023 20:07     PROCEDURES:  Critical Care performed: No  .1-3 Lead EKG Interpretation  Performed by: Irean Hong, MD Authorized by: Irean Hong, MD     Interpretation: abnormal     ECG rate:  110   ECG rate assessment: tachycardic     Rhythm: sinus tachycardia     Ectopy: none     Conduction: normal   Comments:     Patient placed on cardiac monitor to evaluate for arrhythmias    MEDICATIONS ORDERED IN ED: Medications  sodium chloride 0.9 % bolus 1,000 mL (1,000 mLs Intravenous Bolus 08/20/23 0240)     IMPRESSION / MDM / ASSESSMENT AND PLAN / ED COURSE  I reviewed the triage vital signs and the nursing notes.                             89 year old female presenting with cough, generalized weakness, altered mental status. Differential diagnosis includes, but is not limited to, alcohol, illicit or  prescription medications, or other toxic ingestion; intracranial pathology such as stroke or intracerebral hemorrhage; fever or infectious causes including sepsis; hypoxemia and/or hypercarbia; uremia; trauma; endocrine related disorders such as diabetes, hypoglycemia, and thyroid-related diseases; hypertensive encephalopathy; etc. I personally reviewed patient's records and note ED visit 08/13/2023 for fall.  Patient's presentation is most consistent with acute illness / injury with system symptoms.  The patient is on the cardiac monitor to evaluate for evidence of arrhythmia and/or significant heart rate changes.  Laboratory results demonstrate normal WBC 6.3, unremarkable electrolytes, minimally elevated troponin which is likely secondary to demand ischemia.  Patient is positive for RSV.  Chest x-ray obtained demonstrates no focal infiltrates.  Patient is difficult to arouse and cannot sit up on her own.  Will administer IV fluids, check blood cultures and lactic acid.  Will consult hospitalist services for evaluation admission.      FINAL CLINICAL IMPRESSION(S) / ED DIAGNOSES   Final diagnoses:  Altered mental status, unspecified altered mental status type  Dehydration  Generalized weakness  RSV infection     Rx / DC Orders   ED Discharge Orders     None        Note:  This document was prepared using Dragon voice recognition software and may include unintentional dictation errors.   Irean Hong, MD 08/20/23 [phone]

## 2023-08-20 NOTE — H&P (Signed)
History and Physical    Patient: Amy [name]:[id number] DOB: 25-Feb-1927 DOA: 08/20/2023 DOS: the patient was seen and examined on 08/20/2023 PCP: Marguarite Arbour, MD  Patient coming from: Home-has family and home health RN assistance Medical readiness/disposition: Anticipate patient potentially ready for discharge by 08/22/2023 pending PT/OT evaluation.  Also given patient's progression of dementia and worsening mobility daughter is concerned she may no longer be able to care for her mother at home therefore skilled nursing facility placement is being pursued.  Anticipate patient may likely discharge to skilled nursing facility.  Daughter is hopeful this will be short-term but she understands that based on current clinical findings this may be long-term placement.  She was brought into the ER by EMS due to reports of altered mentation and increased generalized weakness.  She was otherwise alert.  She had a variable heart rate between 50 and 120 bpm with underlying A-fib.  She was hemodynamically stable and CBG was 122 in the field.   Chief Complaint:  Chief Complaint  Patient presents with   Altered Mental Status   HPI: Amy [name] is a 89 y.o. female with medical history significant of atrial fibrillation on Eliquis, dyslipidemia, hypertension, peripheral vascular disease, idiopathic cardiomyopathy with an EF of 45 to 50%.Upon arrival to the ER she was normotensive, afebrile and O2 sats were 100% on room air.  Labs were unremarkable except for mildly elevated BUN of 30, she did have a slightly elevated troponin at 18.  Urinalysis unremarkable but patient is also on Macrodantin for UTI prophylaxis.  Viral PCR was positive for RSV.  Chest x-ray unremarkable and no focal infiltrates were noted.  CT head unremarkable in regards to acute abnormalities.  Hospital service has been asked to evaluate the patient for admission.  Review of Systems: Lengthy discussion held with patient's daughter.   Patient has been noted to have worsening/progression in dementia symptoms.  She has become less active.  She continues to mobilize with a rolling walker but spends most of her days sitting in a chair with her legs elevated.  Daughter reports patient having difficulty swallowing larger pills.  She has developed urinary incontinence and now requires depends continuously.  Daughter verbalizes concerns over inability to care for her mother as she becomes progressively weaker.  Daughter also states she has a husband with cancer at home who she is primary caretaker of his well.  He does have some family members who can assist but for the most part they are limited in the amount of assistance they can provide.  She recently had a fall and sustained a laceration.  She presented to the ED on 1/20 and required staple application to her posterior scalp.  The staples will be ready to remove on 08/24/2023.  Past Medical History:  Diagnosis Date   Glaucoma    Hyperlipidemia    Hypertension    Peripheral vascular disease (HCC)    Past Surgical History:  Procedure Laterality Date   EYE SURGERY     Social History:  reports that she has never smoked. She has never used smokeless tobacco. She reports that she does not drink alcohol and does not use drugs.  Allergies  Allergen Reactions   Sulfa Antibiotics Rash    Family History  Problem Relation Age of Onset   Varicose Veins Mother    Deep vein thrombosis Mother    Varicose Veins Father    Varicose Veins Daughter    Varicose Veins Son  Prior to Admission medications   Medication Sig Start Date End Date Taking? Authorizing Provider  aspirin EC 81 MG tablet Take by mouth.    [provider]  Biotin 1000 MCG tablet Take by mouth.    [provider]  Cholecalciferol (VITAMIN D3) 2000 units capsule Take by mouth.    [provider]  fluticasone (FLONASE) 50 MCG/ACT nasal spray Place into the nose. 08/11/16 08/11/17  [provider]  magnesium oxide (MAG-OX) 400 MG tablet TAKE 1 TABLET (400 MG TOTAL) BY MOUTH ONCE DAILY. 05/10/17   [provider]  meloxicam (MOBIC) 7.5 MG tablet TAKE 1 TABLET (7.5 MG TOTAL) BY MOUTH ONCE DAILY. 04/14/17   [provider]  metoprolol succinate (TOPROL-XL) 25 MG 24 hr tablet Take by mouth. 03/08/17 03/08/18  [provider]  Multiple Vitamin (MULTI-VITAMINS) TABS Take by mouth.    [provider]  Omega-3 Fatty Acids (FISH OIL PO) Take by mouth.    [provider]  polyethylene glycol (MIRALAX / GLYCOLAX) packet Take by mouth.    [provider]  vitamin B-12 (CYANOCOBALAMIN) 1000 MCG tablet Take by mouth.    [provider]    Physical Exam: Vitals:   08/20/23 0630 08/20/23 0747 08/20/23 1214 08/20/23 1215  BP: 111/79 109/68 (!) 112/54   Pulse: 66 60 62   Resp: 12 16 18    Temp:  97.9 F (36.6 C)  98 F (36.7 C)  TempSrc:  Oral  Oral  SpO2: 97% 98% 98%   Weight:      Height:       Constitutional: NAD, calm, comfortable-upon my initial evaluation of the patient she was very sleepy.  I returned later to speak again with the patient's daughter and at that time the patient was much more awake. Respiratory: clear to auscultation bilaterally, no wheezing, no crackles. Normal respiratory effort. No accessory muscle use.  Room air Cardiovascular: Regular rate and rhythm, no murmurs / rubs / gallops. No extremity edema. 2+ pedal pulses.  Abdomen: no tenderness, no masses palpated. No hepatosplenomegaly. Bowel sounds positive.  Musculoskeletal: no clubbing / cyanosis. No joint deformity upper and lower extremities. Good ROM, no contractures.  Skin: no rashes, lesions, ulcers. No induration-posterior scalp in staple line were not examined Neurologic: CN 2-12 grossly intact. Sensation intact, Strength 3/5 x all 4 extremities.  Psychiatric: Alert and oriented to name.  Orientation likely affected by patient's significant  hearing loss.  She normally wears bilateral hearing aids.  Pleasant mood.     Data Reviewed:  Sodium 138, potassium 3.8, glucose 93, BUN 30, creatinine 0.62, LFTs are normal, albumin 3.3, total protein 5.9  Troponin 18 with follow-up troponin 16  Viral respiratory panel as above  Imaging as above  Assessment and Plan: Altered mental status/underlying dementia/acute RSV infection Stable RSV infection noting no hypoxemia, cough or apparent shortness of breath Patient has had progressive altered mentation as well as changes in her ADLs over the past several months and daughter is concerned patient will need placement Supportive care for RSV  PT/OT evaluation-TOC consultation for possible placement Rule out UTI.  Patient is on Macrodantin for UTI prophylaxis therefore urinalysis appears normal.  Follow-up on urine culture-bladder scan as needed to assess for possible urinary retention  Difficulty swallowing SLP evaluation  Protein calorie malnutrition Weight is normal but albumin and total protein slightly abnormal and daughter reports patient not eating as much as she used to and of course she is having difficulty swallowing  which may impact intake Nutrition consultation  Slight elevation in troponin Due to demand ischemia Trend is flat and downward No additional workup indicated  Hypertension At rest patient blood pressure within normal limits but when orthostatic vital signs were taken BP actually increased Continue home medications of lisinopril 2.5 mg daily Hold Lasix for now-according to daughter was on 20 mg every other day Cardiologist recently took her off of beta-blocker  Idiopathic cardiomyopathy EF 45 to 50% Medications as above Patient does have some mild lower extremity edema at home and per recommendation of cardiologist Lasix had been decreased to every other day and was primarily being used for edema I suspect the edema is likely related to low  protein  Chronic atrial fibrillation Currently rate controlled although apparently had issues with tachycardia presentation No longer on beta-blocker for rate control Continue low-dose Eliquis     Advance Care Planning:   Code Status: Do not attempt resuscitation (DNR) PRE-ARREST INTERVENTIONS DESIRED -confirmed by daughter at bedside who is also her healthcare power of attorney  VTE prophylaxis: Eliquis  Consults: None  Family Communication: Daughter at bedside  Severity of Illness: The appropriate patient status for this patient is OBSERVATION. Observation status is judged to be reasonable and necessary in order to provide the required intensity of service to ensure the patient's safety. The patient's presenting symptoms, physical exam findings, and initial radiographic and laboratory data in the context of their medical condition is felt to place them at decreased risk for further clinical deterioration. Furthermore, it is anticipated that the patient will be medically stable for discharge from the hospital within 2 midnights of admission.   Author: Junious Silk, NP 08/20/2023 2:11 PM  For on call review [email or web address].

## 2023-08-20 NOTE — Evaluation (Signed)
Clinical/Bedside Swallow Evaluation Patient Details  Name: Amy [name] MRN: [id number] Date of Birth: [birth year]  Today's Date: 08/20/2023 Time: SLP Start Time (ACUTE ONLY): 1330 SLP Stop Time (ACUTE ONLY): 1405 SLP Time Calculation (min) (ACUTE ONLY): 35 min  Past Medical History:  Past Medical History:  Diagnosis Date   Glaucoma    Hyperlipidemia    Hypertension    Peripheral vascular disease (HCC)    Past Surgical History:  Past Surgical History:  Procedure Laterality Date   EYE SURGERY     HPI:  Amy [name] is a 89 y.o. female with medical history significant of atrial fibrillation on Eliquis, dyslipidemia, hypertension, peripheral vascular disease, idiopathic cardiomyopathy with an EF of 45 to 50%.Upon arrival to the ER she was normotensive, afebrile and O2 sats were 100% on room air.  Labs were unremarkable except for mildly elevated BUN of 30, she did have a slightly elevated troponin at 18.  Urinalysis unremarkable but patient is also on Macrodantin for UTI prophylaxis.  Viral PCR was positive for RSV.  Chest x-ray unremarkable and no focal infiltrates were noted.  CT head unremarkable in regards to acute abnormalities. Pt is currently on a regular solids and thin liquids diet.    Assessment / Plan / Recommendation  Clinical Impression  Pt seen for bedside swallow assessment in the setting of concern for swallowing pills. Pt on room air, alert, and agreeable to SLP evaluation. Pt seen with trials of thin liquids (via straw), puree, and regular solids. Cough noted x1 with thin liquids via straw, not repeated with further assessment. No change to vocal quality across trials. Vitals stable for duration of trials. Oral phase grossly intact- with complete manipulation and clearance of regular solid from oral cavity.       SLP spoke with pt's daughter (Ms. Amy [name]) who reported pt with history of difficulty swallowing large pills, specifically supplements. When large pills are  prescribed she would cut them for ease of swallow. Similarly, pt has increased challenge with dry meats.      Based on age and current debility, pt is at increased risk of aspiration, therefore recommend aspiration precautions (slow rate, small bites, elevated HOB, and alert for PO intake). Further, cognitive decline also increases risk for aspiration, leading to recommendation for limiting distractions during meals and allowing pt to feed self for optimal attention to task of eating. Continue with regular diet (with cut meats and extra sauces and gravies) and thin liquids. MD and RN aware of recommendations. No further SLP intervention indicated at this time. SLP Visit Diagnosis: Dysphagia, unspecified (R13.10)    Aspiration Risk  Mild aspiration risk    Diet Recommendation   Thin;Age appropriate regular  Medication Administration: Whole meds with liquid (vs cut for large pills)    Other  Recommendations Oral Care Recommendations: Oral care BID    Recommendations for follow up therapy are one component of a multi-disciplinary discharge planning process, led by the attending physician.  Recommendations may be updated based on patient status, additional functional criteria and insurance authorization.  Follow up Recommendations No SLP follow up        Swallow Study   General Date of Onset: 08/20/23 HPI: Amy [name] is a 89 y.o. female with medical history significant of atrial fibrillation on Eliquis, dyslipidemia, hypertension, peripheral vascular disease, idiopathic cardiomyopathy with an EF of 45 to 50%.Upon arrival to the ER she was normotensive, afebrile and O2 sats were 100% on room air.  Labs were unremarkable  except for mildly elevated BUN of 30, she did have a slightly elevated troponin at 18.  Urinalysis unremarkable but patient is also on Macrodantin for UTI prophylaxis.  Viral PCR was positive for RSV.  Chest x-ray unremarkable and no focal infiltrates were noted.  CT head  unremarkable in regards to acute abnormalities. Pt is currently on a regular solids and thin liquids diet. Type of Study: Bedside Swallow Evaluation Previous Swallow Assessment: no Diet Prior to this Study: Regular;Thin liquids (Level 0) Temperature Spikes Noted: No (Temp 98; WBC 6.3) Respiratory Status: Room air History of Recent Intubation: No Behavior/Cognition: Alert;Confused;Distractible Oral Cavity Assessment: Within Functional Limits Oral Care Completed by SLP: Recent completion by staff Oral Cavity - Dentition: Adequate natural dentition Vision: Functional for self-feeding Self-Feeding Abilities: Needs assist (limited by reported arthritis and hand edema) Patient Positioning: Upright in bed Baseline Vocal Quality: Normal Volitional Cough: Strong Volitional Swallow: Unable to elicit    Oral/Motor/Sensory Function Overall Oral Motor/Sensory Function: Within functional limits   Ice Chips Ice chips: Not tested   Thin Liquid Thin Liquid: Impaired Presentation: Straw Oral Phase Impairments:  (none) Oral Phase Functional Implications:  (none) Pharyngeal  Phase Impairments: Cough - Delayed Other Comments: single cough response    Nectar Thick Nectar Thick Liquid: Not tested   Honey Thick Honey Thick Liquid: Not tested   Puree Puree: Within functional limits   Solid     Solid: Within functional limits     Amy Rick Warnick Clapp  MS [facility] SLP  Amy [name] 08/20/2023,2:48 PM

## 2023-08-20 NOTE — Evaluation (Signed)
Occupational Therapy Evaluation Patient Details Name: Amy [name] MRN: [id number] DOB: [birth year] Today's Date: 08/20/2023   History of Present Illness 89 y.o. female brought to the ED from home by her daughter with a chief complaint of cough, altered mental status and generalized weakness. Pt  had a fall last week and has 4 staples in place which should be removed. PMH: HTN, HLD, PVD, glaucoma, dementia, a-fib, idiopathic cardiomyopathy with an EF of 45 to 50%.   Clinical Impression   Pt was seen for OT evaluation this date. Prior to hospital admission, pt was living at home alone with her daughter coming in from 930am to 430pm and her sons alternating coming in from 5-8pm daily. Pt was left alone at night. Per daughter, pt was able to wash dishes and take herself to the bathroom. Sometimes she sponge bathed herself and other times her daughter assisted. Daughter managed meds, cooking meals and helping dress the pt. She has had 2 falls recently.  Pt presents to acute OT demonstrating impaired ADL performance and functional mobility 2/2 weakness, low activity tolerance, and balance deficits (See OT problem list for additional functional deficits). Pt denied pain throughout session and vitals were stable throughout. Pt currently requires Min to Mod A for bed mobility and STS from EOB to RW. Min A needed for lateral stepping and a few small forward steps at EOB. Max A for LB brief change and Mod A for peri-care. Pt very fatigued at end of session. OT spoke at length with daughter regarding her increased need for assistance at this time with family unable to provide 24/7 care.  Pt would benefit from skilled OT services to address noted impairments and functional limitations (see below for any additional details) in order to maximize safety and independence while minimizing falls risk and caregiver burden. Do anticipate the need for follow up OT services upon acute hospital DC.        If plan is  discharge home, recommend the following: A lot of help with bathing/dressing/bathroom;A little help with walking and/or transfers;Direct supervision/assist for medications management;Supervision due to cognitive status;Direct supervision/assist for financial management;Assist for transportation;Assistance with cooking/housework;Help with stairs or ramp for entrance    Functional Status Assessment  Patient has had a recent decline in their functional status and demonstrates the ability to make significant improvements in function in a reasonable and predictable amount of time.  Equipment Recommendations  BSC/3in1;Hospital bed;Other (comment) (defer to next venue)    Recommendations for Other Services       Precautions / Restrictions Precautions Precautions: Fall Restrictions Weight Bearing Restrictions Per Provider Order: No      Mobility Bed Mobility Overal bed mobility: Needs Assistance Bed Mobility: Supine to Sit, Sit to Supine     Supine to sit: Min assist Sit to supine: Min assist, Mod assist   General bed mobility comments: Min A via HHA to get to EOB; then Min/Mod A to return to bed for BLE management; pt able to scoot herself up in bed and long sit in bed with SUP    Transfers Overall transfer level: Needs assistance Equipment used: Rolling walker (2 wheels) Transfers: Sit to/from Stand Sit to Stand: Min assist           General transfer comment: Min A for STS from high gurney x2 and able to take lateral steps and forward steps using RW with Min A and max cueing for sequencing/following directions      Balance Overall balance assessment: Needs assistance  Sitting balance-Leahy Scale: Fair Sitting balance - Comments: CGA/SBA for seated balance at EOB   Standing balance support: Bilateral upper extremity supported, Reliant on assistive device for balance Standing balance-Leahy Scale: Fair Standing balance comment: able to assit with LB hygiene in standing with  unilateral support on RW                           ADL either performed or assessed with clinical judgement   ADL Overall ADL's : Needs assistance/impaired                             Toileting- Clothing Manipulation and Hygiene: Sit to/from stand;Maximal assistance Toileting - Clothing Manipulation Details (indicate cue type and reason): OT provided assist for thoroughness, but pt able to perform parts of hygiene as well; Max A for brief management             Vision         Perception         Praxis         Pertinent Vitals/Pain Pain Assessment Pain Assessment: No/denies pain     Extremity/Trunk Assessment Upper Extremity Assessment Upper Extremity Assessment: Generalized weakness   Lower Extremity Assessment Lower Extremity Assessment: Generalized weakness       Communication Communication Communication: No apparent difficulties;Difficulty following commands/understanding Following commands: Follows one step commands consistently Cueing Techniques: Verbal cues;Tactile cues   Cognition Arousal: Alert Behavior During Therapy: WFL for tasks assessed/performed Overall Cognitive Status: History of cognitive impairments - at baseline                                 General Comments: oriented to person, place and daughter in room; unable to tell date/time/year     General Comments  VSS throughout eval    Exercises Other Exercises Other Exercises: Edu on role of OT in acute setting and spoke at length with daughter regarding pt's need for increased assistance and family unable to provide 24/7 care.   Shoulder Instructions      Home Living Family/patient expects to be discharged to:: Private residence Living Arrangements: Alone (only alone at night) Available Help at Discharge: Family;Available PRN/intermittently (daughter there 930am-430 pm; sons come in about 5pm-8pm) Type of Home: House Home Access: Stairs to  enter [employer] of Steps: 2; extended rail and assist from daughter to get up steps Entrance Stairs-Rails: Left Home Layout: Two level;Able to live on main level with bedroom/bathroom     Bathroom Shower/Tub: Tub/shower unit;Sponge bathes at baseline         Home Equipment: Agricultural consultant (2 wheels);Rollator (4 wheels);Grab bars - toilet   Additional Comments: pt [facility] HH [facility] the last few weeks      Prior Functioning/Environment Prior Level of Function : History of Falls (last six months)             Mobility Comments: daughter there 930am-430 pm to assist with dressing/diaper changes, cooks breakfast, med management; sons come in about 5pm-8pm and provide dinner; daughter assists with steps and car transfers; 2 falls recently ADLs Comments: pt is able to wash dishes, go to the bathroom, can sponge bathe, somtimes daughter is assisting with that        OT Problem List: Decreased strength;Decreased activity tolerance;Impaired balance (sitting and/or standing);Decreased cognition  OT Treatment/Interventions: Self-care/ADL training;Therapeutic exercise;Therapeutic activities;Energy conservation;Patient/family education;DME and/or AE instruction;Balance training    OT Goals(Current goals can be found in the care plan section) Acute Rehab OT Goals Patient Stated Goal: improve strength OT Goal Formulation: With patient/family Time For Goal Achievement: 09/03/23 Potential to Achieve Goals: Good ADL Goals Pt Will Perform Grooming: sitting;with supervision Pt Will Transfer to Toilet: with supervision;with contact guard assist;ambulating;bedside commode;regular height toilet;grab bars Pt Will Perform Toileting - Clothing Manipulation and hygiene: with supervision;sit to/from stand;sitting/lateral leans  OT Frequency: Min 1X/week    Co-evaluation              AM-PAC OT "6 Clicks" Daily Activity     Outcome Measure  Help from another person eating meals?: None Help from another person taking care of personal grooming?: A Little Help from another person toileting, which includes using toliet, bedpan, or urinal?: A Lot Help from another person bathing (including washing, rinsing, drying)?: A Lot Help from another person to put on and taking off regular upper body clothing?: A Little Help from another person to put on and taking off regular lower body clothing?: A Lot 6 Click Score: 16   End of Session Equipment Utilized During Treatment: Rolling walker (2 wheels) Nurse Communication: Mobility status  Activity Tolerance: Patient tolerated treatment well Patient left: in bed;with call bell/phone within reach;with family/visitor present  OT Visit Diagnosis: Other abnormalities of gait and mobility (R26.89);Unsteadiness on feet (R26.81);Muscle weakness (generalized) (M62.81);History of falling (Z91.81)                Time: 1610-9604 OT Time Calculation (min): 54 min Charges:  OT General Charges $OT Visit: 1 Visit OT Evaluation $OT Eval Moderate Complexity: 1 Mod OT Treatments $Self Care/Home Management : 38-52 mins Daishon Chui, OTR/L 08/20/23, 4:30 PM  Constance Goltz 08/20/2023, 4:27 PM

## 2023-08-20 NOTE — Plan of Care (Signed)
?  Problem: Education: ?Goal: Knowledge of General Education information will improve ?Description: Including pain rating scale, medication(s)/side effects and non-pharmacologic comfort measures ?Outcome: Progressing ?  ?Problem: Health Behavior/Discharge Planning: ?Goal: Ability to manage health-related needs will improve ?Outcome: Progressing ?  ?Problem: Clinical Measurements: ?Goal: Ability to maintain clinical measurements within normal limits will improve ?Outcome: Progressing ?  ?Problem: Activity: ?Goal: Risk for activity intolerance will decrease ?Outcome: Progressing ?  ?Problem: Nutrition: ?Goal: Adequate nutrition will be maintained ?Outcome: Progressing ?  ?Problem: Elimination: ?Goal: Will not experience complications related to bowel motility ?Outcome: Progressing ?  ?Problem: Safety: ?Goal: Ability to remain free from injury will improve ?Outcome: Progressing ?  ?Problem: Skin Integrity: ?Goal: Risk for impaired skin integrity will decrease ?Outcome: Progressing ?  ?

## 2023-08-21 DIAGNOSIS — R531 Weakness: Secondary | ICD-10-CM | POA: Diagnosis not present

## 2023-08-21 DIAGNOSIS — E43 Unspecified severe protein-calorie malnutrition: Secondary | ICD-10-CM | POA: Insufficient documentation

## 2023-08-21 LAB — CBC
HCT: 32.5 % — ABNORMAL LOW (ref 36.0–46.0)
Hemoglobin: 10.9 g/dL — ABNORMAL LOW (ref 12.0–15.0)
MCH: 32.8 pg (ref 26.0–34.0)
MCHC: 33.5 g/dL (ref 30.0–36.0)
MCV: 97.9 fL (ref 80.0–100.0)
Platelets: 232 10*3/uL (ref 150–400)
RBC: 3.32 MIL/uL — ABNORMAL LOW (ref 3.87–5.11)
RDW: 13.2 % (ref 11.5–15.5)
WBC: 5 10*3/uL (ref 4.0–10.5)
nRBC: 0 % (ref 0.0–0.2)

## 2023-08-21 LAB — URINE CULTURE: Culture: NO GROWTH

## 2023-08-21 LAB — BASIC METABOLIC PANEL
Anion gap: 6 (ref 5–15)
BUN: 18 mg/dL (ref 8–23)
CO2: 22 mmol/L (ref 22–32)
Calcium: 8.5 mg/dL — ABNORMAL LOW (ref 8.9–10.3)
Chloride: 109 mmol/L (ref 98–111)
Creatinine, Ser: 0.54 mg/dL (ref 0.44–1.00)
GFR, Estimated: >60 mL/min (ref >60)
Glucose, Bld: 87 mg/dL (ref 70–99)
Potassium: 3.5 mmol/L (ref 3.5–5.1)
Sodium: 137 mmol/L (ref 135–145)

## 2023-08-21 MED ORDER — ADULT MULTIVITAMIN W/MINERALS CH
1.0000 | ORAL_TABLET | Freq: Every day | ORAL | Status: DC
  Administered 2023-08-21 – 2023-08-29 (×9): 1 via ORAL
  Filled 2023-08-21 (×9): qty 1

## 2023-08-21 MED ORDER — ENSURE ENLIVE PO LIQD
237.0000 mL | Freq: Two times a day (BID) | ORAL | Status: DC
  Administered 2023-08-22 – 2023-08-27 (×10): 237 mL via ORAL

## 2023-08-21 NOTE — Progress Notes (Addendum)
PROGRESS NOTE    Amy [name]   ZOX:[id number] DOB: [birth year]  DOA: 08/20/2023 Date of Service: 08/21/23 which is hospital day 0  PCP: Marguarite Arbour, MD    Hospital course / significant events:   HPI: Amy [name] is a 89 y.o. 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 Patient has been noted to have worsening/progression in dementia symptoms. She has become less active. She continues to mobilize with a rolling walker but spends most of her days sitting in a chair with her legs elevated. Daughter reports patient having difficulty swallowing larger pills. She has developed urinary incontinence and now requires depends continuously. Daughter verbalizes concerns over inability to care for her mother as she becomes progressively weaker. Daughter also states she has a husband with cancer at home who she is primary caretaker of his well.   01/27: Admitted to hospitalist service for supportive care for RSV, AMS/weakness, family seeking placement, PT/OT eval, recommendations for SNF 01/28: SNF pending     Consultants:  none  Procedures/Surgeries: none      ASSESSMENT & PLAN:   Altered mental status/metabolic encephalopathy due to acute RSV infection, complicated by underlying dementia likely progressing Encephalopathy improving/resolved to baseline Stable RSV infection noting no hypoxemia Supportive care for RSV   Debility, generalized weakness  PT/OT evaluation-TOC consultation for placement   Difficulty swallowing SLP evaluation --> mild aspiration risk, thin liquids okay   Protein calorie malnutrition RD consultation   Slight elevation in troponin Due to demand ischemia Trend is flat and downward No additional workup indicated   Hypertension At rest patient blood pressure within normal limits but when orthostatic vital signs  were taken BP actually increased Continue home medications of lisinopril 2.5 mg daily Hold Lasix for now-according to daughter was on 20 mg every other day Cardiologist recently took her off of beta-blocker   Idiopathic cardiomyopathy EF 45 to 50% Medications as above Patient does have some mild lower extremity edema at home and per recommendation of cardiologist Lasix had been decreased to every other day and was primarily being used for edema, suspect the edema is likely related to low protein   Chronic atrial fibrillation Currently rate controlled although apparently had tachycardia on presentation No longer on beta-blocker for rate control Continue low-dose Eliquis      DVT prophylaxis: eliquis IV fluids: no continuous IV fluids  Nutrition: regular diet  Central lines / invasive devices: none  Code Status: DNR ACP documentation reviewed:  none on file in [facility]  TOC needs: placement Barriers to dispo / significant pending items: placement          Subjective / Brief ROS:  Patient reports feeling better today, more energetic. She is mildly/pleasantly confused Daughter is present at bedside on rounds, she confirms worsening confusion/weakness over the past few weeks, substantially worse recently as per HPI Denies CP/SOB.  Pain controlled.  Denies new weakness.  Tolerating diet but low appetitie.  Reports no concerns w/ urination/defecation.   Family Communication: Daughter is present at bedside on rounds, extensive discussion of lab results, disposition plan/SNF    Objective Findings:  Vitals:   08/20/23 2233 08/20/23 2317 08/21/23 0838 08/21/23 1552  BP: 115/68 124/83 (!) 140/77 128/85  Pulse: 90 93 62 80  Resp: 18 16 15 16   Temp: 98.4 F (36.9 C) 98.3 F (36.8 C) (!) 97.5 F (36.4 C) 98.1 F (36.7 C)  TempSrc: Axillary  Oral Oral  SpO2: 97% 97% 98% 95%  Weight:      Height:        Intake/Output Summary (Last 24 hours) at 08/21/2023 1625 Last  data filed at 08/21/2023 1010 Gross per 24 hour  Intake 1716.12 ml  Output --  Net 1716.12 ml   Filed Weights   08/19/23 1927  Weight: 59 kg    Examination:  Physical Exam Constitutional:      General: She is not in acute distress.    Appearance: She is not ill-appearing.  Cardiovascular:     Rate and Rhythm: Normal rate and regular rhythm.  Pulmonary:     Effort: Pulmonary effort is normal.     Breath sounds: Normal breath sounds.  Abdominal:     General: Abdomen is flat.     Palpations: Abdomen is soft.  Musculoskeletal:     Right lower leg: No edema.     Left lower leg: No edema.  Skin:    General: Skin is warm and dry.  Neurological:     Mental Status: She is alert. Mental status is at baseline. She is disoriented.  Psychiatric:        Mood and Affect: Mood normal.        Behavior: Behavior normal.          Scheduled Medications:   apixaban  2.5 mg Oral BID   [START ON 08/22/2023] feeding supplement  237 mL Oral BID BM   lisinopril  2.5 mg Oral Daily   multivitamin with minerals  1 tablet Oral Daily   nitrofurantoin  50 mg Oral Q1200   sodium chloride flush  3 mL Intravenous Q12H   vitamin B-12  100 mcg Oral Daily    Continuous Infusions:   PRN Medications:  acetaminophen **OR** acetaminophen, ondansetron **OR** ondansetron (ZOFRAN) IV  Antimicrobials from admission:  Anti-infectives (From admission, onward)    Start     Dose/Rate Route Frequency Ordered Stop   08/21/23 1200  nitrofurantoin (MACRODANTIN) capsule 50 mg        50 mg Oral Daily 08/20/23 1416             Data Reviewed:  I have personally reviewed the following...  CBC: Recent Labs  Lab 08/20/23 0047 08/21/23 0346  WBC 6.3 5.0  HGB 12.1 10.9*  HCT 36.0 32.5*  MCV 97.6 97.9  PLT 260 232   Basic Metabolic Panel: Recent Labs  Lab 08/20/23 0047 08/21/23 0346  NA 138 137  K 3.8 3.5  CL 107 109  CO2 22 22  GLUCOSE 93 87  BUN 30* 18  CREATININE 0.62 0.54  CALCIUM  9.6 8.5*   GFR: Estimated Creatinine Clearance: 34 mL/min (by C-G formula based on SCr of 0.54 mg/dL). Liver Function Tests: Recent Labs  Lab 08/20/23 0047  AST 27  ALT 22  ALKPHOS 75  BILITOT 0.7  PROT 5.9*  ALBUMIN 3.3*   No results for input(s): "LIPASE", "AMYLASE" in the last 168 hours. No results for input(s): "AMMONIA" in the last 168 hours. Coagulation Profile: No results for input(s): "INR", "PROTIME" in the last 168 hours. Cardiac Enzymes: No results for input(s): "CKTOTAL", "CKMB", "CKMBINDEX", "TROPONINI" in the last 168 hours. BNP (last 3 results) No results for input(s): "PROBNP" in the last 8760 hours. HbA1C: No results for input(s): "HGBA1C" in the last 72 hours. CBG: No results for input(s): "GLUCAP" in the last 168 hours. Lipid Profile: No results for input(s): "CHOL", "HDL", "LDLCALC", "TRIG", "  CHOLHDL", "LDLDIRECT" in the last 72 hours. Thyroid Function Tests: No results for input(s): "TSH", "T4TOTAL", "FREET4", "T3FREE", "THYROIDAB" in the last 72 hours. Anemia Panel: No results for input(s): "VITAMINB12", "FOLATE", "FERRITIN", "TIBC", "IRON", "RETICCTPCT" in the last 72 hours. Most Recent Urinalysis On File:     Component Value Date/Time   COLORURINE YELLOW (A) 08/20/2023 0333   APPEARANCEUR CLEAR (A) 08/20/2023 0333   APPEARANCEUR Hazy 07/25/2012 1734   LABSPEC 1.019 08/20/2023 0333   LABSPEC 1.014 07/25/2012 1734   PHURINE 5.0 08/20/2023 0333   GLUCOSEU 50 (A) 08/20/2023 0333   GLUCOSEU Negative 07/25/2012 1734   HGBUR NEGATIVE 08/20/2023 0333   BILIRUBINUR NEGATIVE 08/20/2023 0333   BILIRUBINUR Negative 07/25/2012 1734   KETONESUR NEGATIVE 08/20/2023 0333   PROTEINUR NEGATIVE 08/20/2023 0333   NITRITE NEGATIVE 08/20/2023 0333   LEUKOCYTESUR NEGATIVE 08/20/2023 0333   LEUKOCYTESUR Trace 07/25/2012 1734   Sepsis Labs: @LABRCNTIP (procalcitonin:4,lacticidven:4) Microbiology: Recent Results (from the past 240 hours)  Resp panel by RT-PCR  (RSV, Flu A&B, Covid) Anterior Nasal Swab     Status: Abnormal   Collection Time: 08/19/23  7:34 PM   Specimen: Anterior Nasal Swab  Result Value Ref Range Status   SARS Coronavirus 2 by RT PCR NEGATIVE NEGATIVE Final    Comment: (NOTE) SARS-CoV-2 target nucleic acids are NOT DETECTED.  The SARS-CoV-2 RNA is generally detectable in upper respiratory specimens during the acute phase of infection. The lowest concentration of SARS-CoV-2 viral copies this assay can detect is 138 copies/mL. A negative result does not preclude SARS-Cov-2 infection and should not be used as the sole basis for treatment or other patient management decisions. A negative result may occur with  improper specimen collection/handling, submission of specimen other than nasopharyngeal swab, presence of viral mutation(s) within the areas targeted by this assay, and inadequate number of viral copies(<138 copies/mL). A negative result must be combined with clinical observations, patient history, and epidemiological information. The expected result is Negative.  Fact Sheet for Patients:  BloggerCourse.com  Fact Sheet for Healthcare Providers:  SeriousBroker.it  This test is no t yet approved or cleared by the [place] FDA and  has been authorized for detection and/or diagnosis of SARS-CoV-2 by FDA under an Emergency Use Authorization (EUA). This EUA will remain  in effect (meaning this test can be used) for the duration of the COVID-19 declaration under Section 564(b)(1) of the Act, 21 U.S.C.section 360bbb-3(b)(1), unless the authorization is terminated  or revoked sooner.       Influenza A by PCR NEGATIVE NEGATIVE Final   Influenza B by PCR NEGATIVE NEGATIVE Final    Comment: (NOTE) The Xpert Xpress SARS-CoV-2/FLU/RSV plus assay is intended as an aid in the diagnosis of influenza from Nasopharyngeal swab specimens and should not be used as a sole basis for  treatment. Nasal washings and aspirates are unacceptable for Xpert Xpress SARS-CoV-2/FLU/RSV testing.  Fact Sheet for Patients: BloggerCourse.com  Fact Sheet for Healthcare Providers: SeriousBroker.it  This test is not yet approved or cleared by the [place] FDA and has been authorized for detection and/or diagnosis of SARS-CoV-2 by FDA under an Emergency Use Authorization (EUA). This EUA will remain in effect (meaning this test can be used) for the duration of the COVID-19 declaration under Section 564(b)(1) of the Act, 21 U.S.C. section 360bbb-3(b)(1), unless the authorization is terminated or revoked.     Resp Syncytial Virus by PCR POSITIVE (A) NEGATIVE Final    Comment: (NOTE) Fact Sheet for Patients: BloggerCourse.com  Fact Sheet  for Healthcare Providers: SeriousBroker.it  This test is not yet approved or cleared by the [place] and has been authorized for detection and/or diagnosis of SARS-CoV-2 by FDA under an Emergency Use Authorization (EUA). This EUA will remain in effect (meaning this test can be used) for the duration of the COVID-19 declaration under Section 564(b)(1) of the Act, 21 U.S.C. section 360bbb-3(b)(1), unless the authorization is terminated or revoked.  Performed at [facility], [address] [place], [place] [address]   Culture, blood (routine x 2)     Status: None (Preliminary result)   Collection Time: 08/20/23  2:38 AM   Specimen: BLOOD  Result Value Ref Range Status   Specimen Description BLOOD RIGHT ARM  Final   Special Requests   Final    BOTTLES DRAWN AEROBIC AND ANAEROBIC Blood Culture results may not be optimal due to an inadequate volume of blood received in culture bottles   Culture   Final    NO GROWTH 1 DAY Performed at [facility], [address], [place], [place] [address]    Report Status  PENDING  Incomplete  Urine Culture     Status: None   Collection Time: 08/20/23  3:33 AM   Specimen: In/Out Cath Urine  Result Value Ref Range Status   Specimen Description   Final    IN/OUT CATH URINE Performed at [facility], [address], [place], [place] [address]    Special Requests   Final    NONE Performed at [facility], [address] [place], [place] [address]    Culture   Final    NO GROWTH Performed at [facility], [address], [place], [place] [address]    Report Status 08/21/2023 FINAL  Final  Culture, blood (routine x 2)     Status: None (Preliminary result)   Collection Time: 08/20/23  6:56 AM   Specimen: BLOOD LEFT HAND  Result Value Ref Range Status   Specimen Description BLOOD LEFT HAND  Final   Special Requests   Final    BOTTLES DRAWN AEROBIC AND ANAEROBIC Blood Culture results may not be optimal due to an inadequate volume of blood received in culture bottles   Culture   Final    NO GROWTH < 24 HOURS Performed at [facility], [address], [place], [place] [address]    Report Status PENDING  Incomplete      Radiology Studies last 3 days: DG Chest Port 1 View Result Date: 08/20/2023 CLINICAL DATA:  Shortness of breath EXAM: PORTABLE CHEST 1 VIEW COMPARISON:  07/15/2012 FINDINGS: Cardiac shadow is enlarged. Aortic calcifications are noted. The lungs are well aerated bilaterally. No focal infiltrate or effusion is seen. No bony abnormality is noted. IMPRESSION: No active disease. Electronically Signed   By: Alcide Clever M.D.   On: 08/20/2023 01:55   CT Head Wo Contrast Result Date: 08/19/2023 CLINICAL DATA:  Head trauma, focal neuro findings (Age 70-64y) EXAM: CT HEAD WITHOUT CONTRAST TECHNIQUE: Contiguous axial images were obtained from the base of the skull through the vertex without intravenous contrast. RADIATION DOSE REDUCTION: This exam was performed according to the departmental dose-optimization program  which includes automated exposure control, adjustment of the mA and/or kV according to patient size and/or use of iterative reconstruction technique. COMPARISON:  CT head 08/13/2023. FINDINGS: Brain: No evidence of acute infarction, hemorrhage, hydrocephalus, extra-axial collection or mass lesion/mass effect. Patchy white matter hypodensities are nonspecific but compatible with chronic  microvascular ischemic disease. Vascular: No hyperdense vessel identified. Calcific atherosclerosis. Skull: No acute fracture.  Posterior left scalp staples. Sinuses/Orbits: Mostly clear sinuses.  No acute orbital findings. Other: Mastoid effusions. IMPRESSION: 1. No evidence of acute intracranial abnormality. 2. Chronic microvascular ischemic disease. Electronically Signed   By: Feliberto Harts M.D.   On: 08/19/2023 20:07          Sunnie Nielsen, DO [facility] 08/21/2023, 4:25 PM    Dictation software may have been used to generate the above note. Typos may occur and escape review in typed/dictated notes. Please contact Dr Lyn Hollingshead directly for clarity if needed.  Staff may message me via secure chat in Epic  but this may not receive an immediate response,  please page me for urgent matters!  If 7PM-7AM, please contact night coverage [email or web address]

## 2023-08-21 NOTE — Progress Notes (Signed)
Initial Nutrition Assessment  DOCUMENTATION CODES:   Severe malnutrition in context of chronic illness  INTERVENTION:  -Ensure Enlive po BID, each supplement provides 350 kcal and 20 grams of protein. -MVI with minerals daily -Magic cup TID with meals, each supplement provides 290 kcal and 9 grams of protein -Feeding assistance with meals -Continue cutting meats & adding extra gravies/sauces to meals  NUTRITION DIAGNOSIS:   Severe Malnutrition related to chronic illness (dementia) as evidenced by severe muscle depletion, severe fat depletion.  GOAL:   Patient will meet greater than or equal to 90% of their needs  MONITOR:   PO intake, Weight trends, Supplement acceptance  REASON FOR ASSESSMENT:   Consult Assessment of nutrition requirement/status  ASSESSMENT:   Pt presents with cough, altered mental status and generalized weakness with recent fall last week. PMH of dementia, afib on Eliquis, dyslipidemia, HTN, peripheral vascular disease, idiopathic cardiomyopathy.   1/20: Presented to ED after fall and requiring staples on posterior scalp. Staples can be removed on 1/31 1/26: Presented to ED w/ chief complaint of cough, generalized weakness, altered mental status related to progression of dementia  Per chart review, pt's daughter was spoken with regarding pt's options for discharge into a skilled nursing facility.   Pt's daughter at bedside during visit and provided most of pt's nutritional hx. Reviewed diet summary with 100% meals eaten yesterday. Pt lives alone but daughter and 2 son's live close by and provide pt her meals; pt has home nurse as well. Pt's daughter reports that she brings pt breakfast and lunch at her home and pt's son brings dinner to pt's home every day. Pt daughter reports that pt drinks 1-2 Premier Protein shakes per day and we discussed the benefits of replacing these with Ensure drinks during her admission and at home. Pt reports that she swallows  foods better when meats are cut and have gravies, jam, or syrup added to them. She reports no chewing difficulties. She also reports liking more sweet and salty foods due to the increased flavor content, such as apple jelly and maple syrup. Pt reports difficulty opening packages and feeding herself due to her worsening arthritis. She says she has the most trouble with gripping objects like utensils and food packaging. Discussed getting her feeding assistance while in hospital.   Pt's documented weight hx limited. Current documented weight is 129 lbs. Pt's daughter reports pt started losing weight about 1-2 years ago, which is when PCP recommended adding protein drinks into her diet. Daughter believes pt's wt has been stabilized since starting the protein drinks.  Pt reports not getting around well at home and uses walker but has trouble with gripping the handles at time due to her arthritis.   Meds: Vitamin B-12 100 mcg daily  Labs: Calcium low, Glucose 87-93  NUTRITION - FOCUSED PHYSICAL EXAM:  Flowsheet Row Most Recent Value  Orbital Region Mild depletion  Upper Arm Region Severe depletion  Thoracic and Lumbar Region Severe depletion  Buccal Region Moderate depletion  Temple Region Moderate depletion  Clavicle Bone Region Severe depletion  Clavicle and Acromion Bone Region Severe depletion  Scapular Bone Region Severe depletion  Dorsal Hand Severe depletion  Patellar Region Severe depletion  Anterior Thigh Region Severe depletion  Posterior Calf Region Severe depletion  Edema (RD Assessment) None  Hair Reviewed  Eyes Reviewed  Mouth Reviewed  Skin Reviewed  Nails Reviewed       Diet Order:   Diet Order  Diet regular Room service appropriate? Yes; Fluid consistency: Thin  Diet effective now                   EDUCATION NEEDS:   Education needs have been addressed  Skin:  Skin Assessment: Reviewed RN Assessment  Last BM:  1/26  Height:   Ht Readings  from Last 1 Encounters:  08/19/23 5\' 2"  (1.575 m)    Weight:   Wt Readings from Last 1 Encounters:  08/19/23 59 kg    Ideal Body Weight:  50 kg  BMI:  Body mass index is 23.78 kg/m.  Estimated Nutritional Needs:   Kcal:  1500-1700  Protein:  80-95 g  Fluid:  1.5-1.7 L    Maceo Pro, MS Dietetic Intern

## 2023-08-21 NOTE — Progress Notes (Signed)
Spoke with pts daughter Amy [name] at [phone].  Discussed the MOON letter with her.  [name] verbalized understanding and acknowledged the information given.  Advised a certified copy would be mailed to the address on file.

## 2023-08-22 DIAGNOSIS — R531 Weakness: Secondary | ICD-10-CM | POA: Diagnosis not present

## 2023-08-22 NOTE — [place] FL2 (Signed)
[place] MEDICAID FL2 LEVEL OF CARE FORM     IDENTIFICATION  Patient Name: Amy [name] Birthdate: Feb 17, 1927 Sex: female Admission Date (Current Location): 08/20/2023  [facility] and [place] Number:  Chiropodist and Address:  [facility], [address] [place], [place] [address]      Provider Number: [id number]  Attending Physician Name and Address:  Lurene Shadow, MD  Relative Name and Phone Number:  Elease Etienne, [phone]    Current Level of Care: Hospital Recommended Level of Care: Skilled Nursing Facility Prior Approval Number:    Date Approved/Denied:   PASRR Number: [id number]  Discharge Plan: SNF    Current Diagnoses: Patient Active Problem List   Diagnosis Date Noted   Protein-calorie malnutrition, severe 08/21/2023   Generalized weakness 08/20/2023   Phlebitis and thombophlb of superfic vessels of l low extrem 05/14/2017   Varicose veins of bilateral lower extremities with other complications 05/14/2017    Orientation RESPIRATION BLADDER Height & Weight     Self, Place  Normal Incontinent, External catheter Weight: 130 lb (59 kg) Height:  5\' 2"  (157.5 cm)  BEHAVIORAL SYMPTOMS/MOOD NEUROLOGICAL BOWEL NUTRITION STATUS      Incontinent Diet (Please see d/c summary)  AMBULATORY STATUS COMMUNICATION OF NEEDS Skin   Extensive Assist Verbally Normal                       Personal Care Assistance Level of Assistance  Bathing, Feeding, Dressing Bathing Assistance: Maximum assistance Feeding assistance: Limited assistance Dressing Assistance: Maximum assistance     Functional Limitations Info             SPECIAL CARE FACTORS FREQUENCY  PT (By licensed PT), OT (By licensed OT)     PT Frequency: 5x/wk OT Frequency: 5x/wk            Contractures Contractures Info: Not present    Additional Factors Info  Code Status Code Status Info: DNR-Interven             Current Medications (08/22/2023):   This is the current hospital active medication list Current Facility-Administered Medications  Medication Dose Route Frequency Provider Last Rate Last Admin   acetaminophen (TYLENOL) tablet 650 mg  650 mg Oral Q6H PRN Russella Dar, NP       Or   acetaminophen (TYLENOL) suppository 650 mg  650 mg Rectal Q6H PRN Russella Dar, NP       apixaban Everlene Balls) tablet 2.5 mg  2.5 mg Oral BID Russella Dar, NP   2.5 mg at 08/22/23 0900   feeding supplement (ENSURE ENLIVE / ENSURE PLUS) liquid 237 mL  237 mL Oral BID BM Sunnie Nielsen, DO   237 mL at 08/22/23 1332   lisinopril (ZESTRIL) tablet 2.5 mg  2.5 mg Oral Daily Russella Dar, NP   2.5 mg at 08/21/23 2204   multivitamin with minerals tablet 1 tablet  1 tablet Oral Daily Sunnie Nielsen, DO   1 tablet at 08/22/23 0900   ondansetron (ZOFRAN) tablet 4 mg  4 mg Oral Q6H PRN Russella Dar, NP       Or   ondansetron [facility]) injection 4 mg  4 mg Intravenous Q6H PRN Russella Dar, NP       sodium chloride flush (NS) 0.9 % injection 3 mL  3 mL Intravenous Q12H Russella Dar, NP   3 mL at 08/22/23 0924   vitamin B-12 (CYANOCOBALAMIN) tablet 100 mcg  100 mcg Oral Daily Russella Dar, NP   100 mcg at 08/22/23 0900     Discharge Medications: Please see discharge summary for a list of discharge medications.  Relevant Imaging Results:  Relevant Lab Results:   Additional Information SS#: [id number]; Tory Emerald, LCSW  [place], [place]

## 2023-08-22 NOTE — Progress Notes (Signed)
Physical Therapy Treatment Patient Details Name: Amy [name] MRN: [id number] DOB: 11-07-1926 Today's Date: 08/22/2023   History of Present Illness Pt is a 89 y.o. female brought to the ED from home by her daughter with a chief complaint of cough, altered mental status and generalized weakness with recent fall history.  MD assessment includes: altered mental status/underlying dementia/acute RSV infection, protein calorie malnutrition, difficulty swallowing, and slight elevation of troponin due to demand ischemia. 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    PT Comments  Pt was pleasant and motivated to participate during the session and put forth good effort throughout.  Pt had difficulty communicating verbally similar to prior session and required extra time and cuing to follow commands.  Pt required significant physical assistance to come to standing during multiple sit to/from stand transfer attempts and once in standing required constant physical assist to prevent posterior LOB.  Pt was able to perform two bouts of limited ambulation where she could amb a max of around 1 foot before needing to return to sitting secondary to fatigue.  Pt's steps were very effortful with short step length and with physical assistance required for stability and to guide the RW.  Pt is at a very high risk for falls and will benefit from continued PT services upon discharge to safely address deficits listed in patient problem list for decreased caregiver assistance and eventual return to PLOF.      If plan is discharge home, recommend the following: Two people to help with walking and/or transfers;A lot of help with bathing/dressing/bathroom;Assistance with cooking/housework;Direct supervision/assist for medications management;Assist for transportation;Help with stairs or ramp for entrance;Direct supervision/assist for financial management;Supervision due  to cognitive status   Can travel by private vehicle     No  Equipment Recommendations  Other (comment) (TBD at next venue of care)    Recommendations for Other Services       Precautions / Restrictions Precautions Precautions: Fall Restrictions Weight Bearing Restrictions Per Provider Order: No     Mobility  Bed Mobility Overal bed mobility: Needs Assistance Bed Mobility: Supine to Sit, Sit to Supine     Supine to sit: Min assist Sit to supine: Mod assist   General bed mobility comments: Assist for BLE and trunk control and cues for use of bed rail    Transfers Overall transfer level: Needs assistance Equipment used: Rolling walker (2 wheels) Transfers: Sit to/from Stand Sit to Stand: Mod assist, From elevated surface           General transfer comment: Mod A to stand and multi-modal cues for hand and foot placement    Ambulation/Gait Ambulation/Gait assistance: Mod assist Gait Distance (Feet): 1 Feet Assistive device: Rolling walker (2 wheels) Gait Pattern/deviations: Step-to pattern, Decreased step length - right, Decreased step length - left, Trunk flexed, Shuffle Gait velocity: decreased     General Gait Details: Pt required mod A for stability and to guide the RW and heavy cuing for sequencing   Stairs             Wheelchair Mobility     Tilt Bed    Modified Rankin (Stroke Patients Only)       Balance Overall balance assessment: Needs assistance Sitting-balance support: Bilateral upper extremity supported Sitting balance-Leahy Scale: Fair     Standing balance support: Bilateral upper extremity supported, Reliant on assistive device for balance, During functional activity Standing balance-Leahy Scale: Poor Standing balance  comment: Constant physical assist needed for stability                            Cognition Arousal: Alert Behavior During Therapy: WFL for tasks assessed/performed Overall Cognitive Status:  Difficult to assess                                          Exercises      General Comments        Pertinent Vitals/Pain Pain Assessment Pain Assessment: No/denies pain    Home Living                          Prior Function            PT Goals (current goals can now be found in the care plan section) Progress towards PT goals: Progressing toward goals    Frequency    Min 1X/week      PT Plan      Co-evaluation              AM-PAC PT "6 Clicks" Mobility   Outcome Measure  Help needed turning from your back to your side while in a flat bed without using bedrails?: A Little Help needed moving from lying on your back to sitting on the side of a flat bed without using bedrails?: A Little Help needed moving to and from a bed to a chair (including a wheelchair)?: A Lot Help needed standing up from a chair using your arms (e.g., wheelchair or bedside chair)?: A Lot Help needed to walk in hospital room?: Total Help needed climbing 3-5 steps with a railing? : Total 6 Click Score: 12    End of Session Equipment Utilized During Treatment: Gait belt Activity Tolerance: Patient tolerated treatment well Patient left: in bed;with call bell/phone within reach;with family/visitor present;with bed alarm set Nurse Communication: Mobility status PT Visit Diagnosis: Unsteadiness on feet (R26.81);History of falling (Z91.81);Difficulty in walking, not elsewhere classified (R26.2);Muscle weakness (generalized) (M62.81)     Time: 1610-9604 PT Time Calculation (min) (ACUTE ONLY): 26 min  Charges:    $Gait Training: 8-22 mins $Therapeutic Activity: 8-22 mins PT General Charges $$ ACUTE PT VISIT: 1 Visit                     D. Scott Edis Huish PT, DPT 08/22/23, 5:17 PM

## 2023-08-22 NOTE — Progress Notes (Addendum)
Progress Note    Amy [name]  ZOX:[id number] DOB: 07/16/1927  DOA: 08/20/2023 PCP: Marguarite Arbour, MD      Brief Narrative:    Medical records reviewed and are as summarized below:  Amy [name] is a 89 y.o. 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 Patient has been noted to have worsening/progression in dementia symptoms. She has become less active. She continues to mobilize with a rolling walker but spends most of her days sitting in a chair with her legs elevated. Daughter reports patient having difficulty swallowing larger pills. She has developed urinary incontinence and now requires depends continuously. Daughter verbalizes concerns over inability to care for her mother as she becomes progressively weaker. Daughter also states she has a husband with cancer at home who she is primary caretaker of his well.    01/27: Admitted to hospitalist service for supportive care for RSV, AMS/weakness, family seeking placement, PT/OT eval, recommendations for SNF 01/28: SNF pending        Assessment/Plan:   Principal Problem:   Generalized weakness Active Problems:   Protein-calorie malnutrition, severe   Nutrition Problem: Severe Malnutrition Etiology: chronic illness (dementia)  Signs/Symptoms: severe muscle depletion, severe fat depletion   Body mass index is 23.78 kg/m.   Altered mental status/metabolic encephalopathy due to acute RSV infection, complicated by underlying dementia likely progressing Encephalopathy improving/resolved to baseline Stable RSV infection.  She is tolerating room air. No acute issues. Discontinue nitrofurantoin.  No evidence of UTI.   Debility, generalized weakness  PT/OT recommended discharge to SNF.  Awaiting placement.   Difficulty swallowing She was evaluated by the speech therapist.   Regular diet and thin liquids recommended.   Severe protein calorie malnutrition Continue nutritional supplements   Slight elevation in troponin Due to demand ischemia Trend is flat and downward No additional workup indicated   Hypertension At rest patient blood pressure within normal limits but when orthostatic vital signs were taken BP actually increased Continue low-dose lisinopril. Hold Lasix for now-according to daughter was on 20 mg every other day Cardiologist recently took her off of beta-blocker   Idiopathic cardiomyopathy EF 45 to 50% Medications as above Patient does have some mild lower extremity edema at home and per recommendation of cardiologist Lasix had been decreased to every other day and was primarily being used for edema, suspect the edema is likely related to low protein   Chronic atrial fibrillation Currently rate controlled although apparently had tachycardia on presentation No longer on beta-blocker for rate control Continue low-dose Eliquis           Diet Order             Diet regular Room service appropriate? Yes; Fluid consistency: Thin  Diet effective now                            Consultants: None  Procedures: None    Medications:    apixaban  2.5 mg Oral BID   feeding supplement  237 mL Oral BID BM   lisinopril  2.5 mg Oral Daily   multivitamin with minerals  1 tablet Oral Daily   nitrofurantoin  50 mg Oral Q1200   sodium chloride flush  3 mL Intravenous Q12H   vitamin B-12  100 mcg Oral Daily   Continuous Infusions:   Anti-infectives (From  admission, onward)    Start     Dose/Rate Route Frequency Ordered Stop   08/21/23 1200  nitrofurantoin (MACRODANTIN) capsule 50 mg        50 mg Oral Daily 08/20/23 1416                Family Communication/Anticipated D/C date and plan/Code Status   DVT prophylaxis: apixaban (ELIQUIS) tablet 2.5 mg Start: 08/20/23 2200 apixaban (ELIQUIS) tablet 2.5 mg      Code Status: Do not attempt resuscitation (DNR) PRE-ARREST INTERVENTIONS DESIRED  Family Communication: None Disposition Plan: Plan to discharge to SNF   Status is: Observation The patient will require care spanning > 2 midnights and should be moved to inpatient because: Awaiting placement to SNF       Subjective:   Interval events noted.  She has no complaints.  Objective:    Vitals:   08/21/23 1552 08/21/23 2059 08/21/23 2204 08/22/23 0805  BP: 128/85 125/71 125/71 121/76  Pulse: 80 98  (!) 53  Resp: 16 17  16   Temp: 98.1 F (36.7 C) 97.9 F (36.6 C)  98.1 F (36.7 C)  TempSrc: Oral Oral  Oral  SpO2: 95% 95%  95%  Weight:      Height:       No data found.  No intake or output data in the 24 hours ending 08/22/23 1142 Filed Weights   08/19/23 1927  Weight: 59 kg    Exam:  GEN: NAD SKIN: Warm and dry EYES: No pallor or icterus ENT: MMM CV: RRR PULM: CTA B ABD: soft, ND, NT, +BS CNS: AAO x 2 (person and place), non focal EXT: No edema or tenderness        Data Reviewed:   I have personally reviewed following labs and imaging studies:  Labs: Labs show the following:   Basic Metabolic Panel: Recent Labs  Lab 08/20/23 0047 08/21/23 0346  NA 138 137  K 3.8 3.5  CL 107 109  CO2 22 22  GLUCOSE 93 87  BUN 30* 18  CREATININE 0.62 0.54  CALCIUM 9.6 8.5*   GFR Estimated Creatinine Clearance: 34 mL/min (by C-G formula based on SCr of 0.54 mg/dL). Liver Function Tests: Recent Labs  Lab 08/20/23 0047  AST 27  ALT 22  ALKPHOS 75  BILITOT 0.7  PROT 5.9*  ALBUMIN 3.3*   No results for input(s): "LIPASE", "AMYLASE" in the last 168 hours. No results for input(s): "AMMONIA" in the last 168 hours. Coagulation profile No results for input(s): "INR", "PROTIME" in the last 168 hours.  CBC: Recent Labs  Lab 08/20/23 0047 08/21/23 0346  WBC 6.3 5.0  HGB 12.1 10.9*  HCT 36.0 32.5*  MCV 97.6 97.9  PLT 260 232   Cardiac Enzymes: No  results for input(s): "CKTOTAL", "CKMB", "CKMBINDEX", "TROPONINI" in the last 168 hours. BNP (last 3 results) No results for input(s): "PROBNP" in the last 8760 hours. CBG: No results for input(s): "GLUCAP" in the last 168 hours. D-Dimer: No results for input(s): "DDIMER" in the last 72 hours. Hgb A1c: No results for input(s): "HGBA1C" in the last 72 hours. Lipid Profile: No results for input(s): "CHOL", "HDL", "LDLCALC", "TRIG", "CHOLHDL", "LDLDIRECT" in the last 72 hours. Thyroid function studies: No results for input(s): "TSH", "T4TOTAL", "T3FREE", "THYROIDAB" in the last 72 hours.  Invalid input(s): "FREET3" Anemia work up: No results for input(s): "VITAMINB12", "FOLATE", "FERRITIN", "TIBC", "IRON", "RETICCTPCT" in the last 72 hours. Sepsis Labs: Recent Labs  Lab 08/20/23 0047 08/20/23  4098 08/20/23 0650 08/21/23 0346  WBC 6.3  --   --  5.0  LATICACIDVEN  --  0.7 0.8  --     Microbiology Recent Results (from the past 240 hours)  Resp panel by RT-PCR (RSV, Flu A&B, Covid) Anterior Nasal Swab     Status: Abnormal   Collection Time: 08/19/23  7:34 PM   Specimen: Anterior Nasal Swab  Result Value Ref Range Status   SARS Coronavirus 2 by RT PCR NEGATIVE NEGATIVE Final    Comment: (NOTE) SARS-CoV-2 target nucleic acids are NOT DETECTED.  The SARS-CoV-2 RNA is generally detectable in upper respiratory specimens during the acute phase of infection. The lowest concentration of SARS-CoV-2 viral copies this assay can detect is 138 copies/mL. A negative result does not preclude SARS-Cov-2 infection and should not be used as the sole basis for treatment or other patient management decisions. A negative result may occur with  improper specimen collection/handling, submission of specimen other than nasopharyngeal swab, presence of viral mutation(s) within the areas targeted by this assay, and inadequate number of viral copies(<138 copies/mL). A negative result must be combined  with clinical observations, patient history, and epidemiological information. The expected result is Negative.  Fact Sheet for Patients:  BloggerCourse.com  Fact Sheet for Healthcare Providers:  SeriousBroker.it  This test is no t yet approved or cleared by the [place] FDA and  has been authorized for detection and/or diagnosis of SARS-CoV-2 by FDA under an Emergency Use Authorization (EUA). This EUA will remain  in effect (meaning this test can be used) for the duration of the COVID-19 declaration under Section 564(b)(1) of the Act, 21 U.S.C.section 360bbb-3(b)(1), unless the authorization is terminated  or revoked sooner.       Influenza A by PCR NEGATIVE NEGATIVE Final   Influenza B by PCR NEGATIVE NEGATIVE Final    Comment: (NOTE) The Xpert Xpress SARS-CoV-2/FLU/RSV plus assay is intended as an aid in the diagnosis of influenza from Nasopharyngeal swab specimens and should not be used as a sole basis for treatment. Nasal washings and aspirates are unacceptable for Xpert Xpress SARS-CoV-2/FLU/RSV testing.  Fact Sheet for Patients: BloggerCourse.com  Fact Sheet for Healthcare Providers: SeriousBroker.it  This test is not yet approved or cleared by the [place] FDA and has been authorized for detection and/or diagnosis of SARS-CoV-2 by FDA under an Emergency Use Authorization (EUA). This EUA will remain in effect (meaning this test can be used) for the duration of the COVID-19 declaration under Section 564(b)(1) of the Act, 21 U.S.C. section 360bbb-3(b)(1), unless the authorization is terminated or revoked.     Resp Syncytial Virus by PCR POSITIVE (A) NEGATIVE Final    Comment: (NOTE) Fact Sheet for Patients: BloggerCourse.com  Fact Sheet for Healthcare Providers: SeriousBroker.it  This test is not yet  approved or cleared by the [place] FDA and has been authorized for detection and/or diagnosis of SARS-CoV-2 by FDA under an Emergency Use Authorization (EUA). This EUA will remain in effect (meaning this test can be used) for the duration of the COVID-19 declaration under Section 564(b)(1) of the Act, 21 U.S.C. section 360bbb-3(b)(1), unless the authorization is terminated or revoked.  Performed at [facility], [address] [place], [place] [address]   Culture, blood (routine x 2)     Status: None (Preliminary result)   Collection Time: 08/20/23  2:38 AM   Specimen: BLOOD  Result Value Ref Range Status   Specimen Description BLOOD RIGHT ARM  Final  Special Requests   Final    BOTTLES DRAWN AEROBIC AND ANAEROBIC Blood Culture results may not be optimal due to an inadequate volume of blood received in culture bottles   Culture   Final    NO GROWTH 2 DAYS Performed at [facility], [address], [place], [place] [address]    Report Status PENDING  Incomplete  Urine Culture     Status: None   Collection Time: 08/20/23  3:33 AM   Specimen: In/Out Cath Urine  Result Value Ref Range Status   Specimen Description   Final    IN/OUT CATH URINE Performed at [facility], [address] [place], [place] [address]    Special Requests   Final    NONE Performed at [facility], [address] [place], [place] [address]    Culture   Final    NO GROWTH Performed at [facility], [address] [place], [place] [address]    Report Status 08/21/2023 FINAL  Final  Culture, blood (routine x 2)     Status: None (Preliminary result)   Collection Time: 08/20/23  6:56 AM   Specimen: BLOOD LEFT HAND  Result Value Ref Range Status   Specimen Description BLOOD LEFT HAND  Final   Special Requests   Final    BOTTLES DRAWN AEROBIC AND ANAEROBIC Blood Culture results may not be optimal due to an inadequate volume of blood received in culture  bottles   Culture   Final    NO GROWTH 2 DAYS Performed at [facility], [address], [place], [place] [address]    Report Status PENDING  Incomplete    Procedures and diagnostic studies:  No results found.             LOS: 0 days   Braidyn Peace  [facility]   Pager on [email or web address]. If 7PM-7AM, please contact night-coverage at [email or web address]     08/22/2023, 11:42 AM

## 2023-08-22 NOTE — TOC Progression Note (Signed)
Transition of Care [facility]) - Progression Note    Patient Details  Name: Amy [name] MRN: [id number] Date of Birth: Dec 04, 1926  Transition of Care [facility]) CM/SW Contact  Tory Emerald, [place] Phone Number: 08/22/2023, 3:53 PM  Clinical Narrative:     SNF work up and [employer] complete.        Expected Discharge Plan and Services                                               Social Determinants of Health (SDOH) Interventions SDOH Screenings   Food Insecurity: Patient Unable To Answer (08/20/2023)  Housing: Patient Unable To Answer (08/20/2023)  Transportation Needs: Patient Unable To Answer (08/20/2023)  Utilities: Patient Unable To Answer (08/20/2023)  Financial Resource Strain: Low Risk  (07/24/2023)   Received from [facility]  Social Connections: Patient Unable To Answer (08/20/2023)  Tobacco Use: Low Risk  (08/19/2023)    Readmission Risk Interventions     No data to display

## 2023-08-22 NOTE — Plan of Care (Signed)
Problem: Clinical Measurements: Goal: Ability to maintain clinical measurements within normal limits will improve Outcome: Progressing Goal: Will remain free from infection Outcome: Progressing Goal: Diagnostic test results will improve Outcome: Progressing Goal: Respiratory complications will improve Outcome: Progressing

## 2023-08-23 DIAGNOSIS — R531 Weakness: Secondary | ICD-10-CM | POA: Diagnosis not present

## 2023-08-23 NOTE — Plan of Care (Signed)
Problem: Clinical Measurements: Goal: Will remain free from infection Outcome: Progressing   Problem: Clinical Measurements: Goal: Respiratory complications will improve Outcome: Progressing

## 2023-08-23 NOTE — Progress Notes (Addendum)
Progress Note    [name]  ZOX:[id number] DOB: 1927-01-10  DOA: 08/20/2023 PCP: Marguarite Arbour, MD      Brief Narrative:    Medical records reviewed and are as summarized below:  Amy [name] is a 89 y.o. 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 Patient has been noted to have worsening/progression in dementia symptoms. She has become less active. She continues to mobilize with a rolling walker but spends most of her days sitting in a chair with her legs elevated. Daughter reports patient having difficulty swallowing larger pills. She has developed urinary incontinence and now requires depends continuously. Daughter verbalizes concerns over inability to care for her mother as she becomes progressively weaker. Daughter also states she has a husband with cancer at home who she is primary caretaker of his well.    01/27: Admitted to hospitalist service for supportive care for RSV, AMS/weakness, family seeking placement, PT/OT eval, recommendations for SNF 01/28: SNF pending        Assessment/Plan:   Principal Problem:   Generalized weakness Active Problems:   Protein-calorie malnutrition, severe   Nutrition Problem: Severe Malnutrition Etiology: chronic illness (dementia)  Signs/Symptoms: severe muscle depletion, severe fat depletion   Body mass index is 23.78 kg/m.   Altered mental status/metabolic encephalopathy due to acute RSV infection, complicated by underlying dementia likely progressing Encephalopathy improving/resolved to baseline Stable RSV infection.  She is tolerating room air. She had fever (101.1 F) on the night of 08/21/2022.  Continue to monitor. No evidence of UTI and recent urinalysis and culture.  No active disease on chest x-ray from 08/20/2023.   Debility, generalized weakness  PT/OT recommended  discharge to SNF.  Awaiting placement.   Difficulty swallowing She was previously evaluated by the speech therapist.  Regular diet and thin liquids recommended at that time. She had another choking episode according to Andrill, Charity fundraiser.  Speech therapist to reevaluate the patient.   Severe protein calorie malnutrition Continue nutritional supplements   Slight elevation in troponin Due to demand ischemia Trend is flat and downward No additional workup indicated   Hypertension At rest patient blood pressure within normal limits but when orthostatic vital signs were taken BP actually increased Continue low-dose lisinopril. Hold Lasix for now-according to daughter was on 20 mg every other day Cardiologist recently took her off of beta-blocker   Idiopathic cardiomyopathy EF 45 to 50% Medications as above Patient does have some mild lower extremity edema at home and per recommendation of cardiologist Lasix had been decreased to every other day and was primarily being used for edema, suspect the edema is likely related to low protein   Chronic atrial fibrillation Currently rate controlled although apparently had tachycardia on presentation No longer on beta-blocker for rate control Continue low-dose Eliquis           Diet Order             Diet regular Room service appropriate? Yes; Fluid consistency: Thin  Diet effective now                            Consultants: None  Procedures: None    Medications:    apixaban  2.5 mg Oral BID   feeding supplement  237 mL Oral BID BM   lisinopril  2.5 mg Oral Daily   multivitamin  with minerals  1 tablet Oral Daily   sodium chloride flush  3 mL Intravenous Q12H   vitamin B-12  100 mcg Oral Daily   Continuous Infusions:   Anti-infectives (From admission, onward)    Start     Dose/Rate Route Frequency Ordered Stop   08/21/23 1200  nitrofurantoin (MACRODANTIN) capsule 50 mg  Status:  Discontinued        50 mg Oral  Daily 08/20/23 1416 08/22/23 1445              Family Communication/Anticipated D/C date and plan/Code Status   DVT prophylaxis: apixaban (ELIQUIS) tablet 2.5 mg Start: 08/20/23 2200 apixaban (ELIQUIS) tablet 2.5 mg     Code Status: Do not attempt resuscitation (DNR) PRE-ARREST INTERVENTIONS DESIRED  Family Communication: None Disposition Plan: Plan to discharge to SNF   Status is: Observation The patient will require care spanning > 2 midnights and should be moved to inpatient because: Awaiting placement to SNF       Subjective:   Interval is noted.  She had fever with Tmax 101.1 F last night.  No other complaints.  No shortness of breath, cough or chest pain.  Objective:    Vitals:   08/22/23 2057 08/22/23 2104 08/22/23 2200 08/23/23 0738  BP: (!) 145/83 (!) 145/83  111/66  Pulse: 91   87  Resp: 20   16  Temp: (!) 101.1 F (38.4 C)  98.6 F (37 C) 97.7 F (36.5 C)  TempSrc:   Oral Oral  SpO2: 96%   96%  Weight:      Height:       No data found.   Intake/Output Summary (Last 24 hours) at 08/23/2023 1217 Last data filed at 08/23/2023 0505 Gross per 24 hour  Intake --  Output 500 ml  Net -500 ml   Filed Weights   08/19/23 1927  Weight: 59 kg    Exam:  GEN: NAD SKIN: Warm and dry EYES: No pallor or icterus ENT: MMM CV: RRR PULM: CTA B ABD: soft, ND, NT, +BS CNS: AAO x 2 (person and place), non focal EXT: No edema or tenderness       Data Reviewed:   I have personally reviewed following labs and imaging studies:  Labs: Labs show the following:   Basic Metabolic Panel: Recent Labs  Lab 08/20/23 0047 08/21/23 0346  NA 138 137  K 3.8 3.5  CL 107 109  CO2 22 22  GLUCOSE 93 87  BUN 30* 18  CREATININE 0.62 0.54  CALCIUM 9.6 8.5*   GFR Estimated Creatinine Clearance: 34 mL/min (by C-G formula based on SCr of 0.54 mg/dL). Liver Function Tests: Recent Labs  Lab 08/20/23 0047  AST 27  ALT 22  ALKPHOS 75  BILITOT 0.7   PROT 5.9*  ALBUMIN 3.3*   No results for input(s): "LIPASE", "AMYLASE" in the last 168 hours. No results for input(s): "AMMONIA" in the last 168 hours. Coagulation profile No results for input(s): "INR", "PROTIME" in the last 168 hours.  CBC: Recent Labs  Lab 08/20/23 0047 08/21/23 0346  WBC 6.3 5.0  HGB 12.1 10.9*  HCT 36.0 32.5*  MCV 97.6 97.9  PLT 260 232   Cardiac Enzymes: No results for input(s): "CKTOTAL", "CKMB", "CKMBINDEX", "TROPONINI" in the last 168 hours. BNP (last 3 results) No results for input(s): "PROBNP" in the last 8760 hours. CBG: No results for input(s): "GLUCAP" in the last 168 hours. D-Dimer: No results for input(s): "DDIMER" in the last 72  hours. Hgb A1c: No results for input(s): "HGBA1C" in the last 72 hours. Lipid Profile: No results for input(s): "CHOL", "HDL", "LDLCALC", "TRIG", "CHOLHDL", "LDLDIRECT" in the last 72 hours. Thyroid function studies: No results for input(s): "TSH", "T4TOTAL", "T3FREE", "THYROIDAB" in the last 72 hours.  Invalid input(s): "FREET3" Anemia work up: No results for input(s): "VITAMINB12", "FOLATE", "FERRITIN", "TIBC", "IRON", "RETICCTPCT" in the last 72 hours. Sepsis Labs: Recent Labs  Lab 08/20/23 0047 08/20/23 0238 08/20/23 0650 08/21/23 0346  WBC 6.3  --   --  5.0  LATICACIDVEN  --  0.7 0.8  --     Microbiology Recent Results (from the past 240 hours)  Resp panel by RT-PCR (RSV, Flu A&B, Covid) Anterior Nasal Swab     Status: Abnormal   Collection Time: 08/19/23  7:34 PM   Specimen: Anterior Nasal Swab  Result Value Ref Range Status   SARS Coronavirus 2 by RT PCR NEGATIVE NEGATIVE Final    Comment: (NOTE) SARS-CoV-2 target nucleic acids are NOT DETECTED.  The SARS-CoV-2 RNA is generally detectable in upper respiratory specimens during the acute phase of infection. The lowest concentration of SARS-CoV-2 viral copies this assay can detect is 138 copies/mL. A negative result does not preclude  SARS-Cov-2 infection and should not be used as the sole basis for treatment or other patient management decisions. A negative result may occur with  improper specimen collection/handling, submission of specimen other than nasopharyngeal swab, presence of viral mutation(s) within the areas targeted by this assay, and inadequate number of viral copies(<138 copies/mL). A negative result must be combined with clinical observations, patient history, and epidemiological information. The expected result is Negative.  Fact Sheet for Patients:  BloggerCourse.com  Fact Sheet for Healthcare Providers:  SeriousBroker.it  This test is no t yet approved or cleared by the [place] FDA and  has been authorized for detection and/or diagnosis of SARS-CoV-2 by FDA under an Emergency Use Authorization (EUA). This EUA will remain  in effect (meaning this test can be used) for the duration of the COVID-19 declaration under Section 564(b)(1) of the Act, 21 U.S.C.section 360bbb-3(b)(1), unless the authorization is terminated  or revoked sooner.       Influenza A by PCR NEGATIVE NEGATIVE Final   Influenza B by PCR NEGATIVE NEGATIVE Final    Comment: (NOTE) The Xpert Xpress SARS-CoV-2/FLU/RSV plus assay is intended as an aid in the diagnosis of influenza from Nasopharyngeal swab specimens and should not be used as a sole basis for treatment. Nasal washings and aspirates are unacceptable for Xpert Xpress SARS-CoV-2/FLU/RSV testing.  Fact Sheet for Patients: BloggerCourse.com  Fact Sheet for Healthcare Providers: SeriousBroker.it  This test is not yet approved or cleared by the [place] FDA and has been authorized for detection and/or diagnosis of SARS-CoV-2 by FDA under an Emergency Use Authorization (EUA). This EUA will remain in effect (meaning this test can be used) for the duration of  the COVID-19 declaration under Section 564(b)(1) of the Act, 21 U.S.C. section 360bbb-3(b)(1), unless the authorization is terminated or revoked.     Resp Syncytial Virus by PCR POSITIVE (A) NEGATIVE Final    Comment: (NOTE) Fact Sheet for Patients: BloggerCourse.com  Fact Sheet for Healthcare Providers: SeriousBroker.it  This test is not yet approved or cleared by the [place] FDA and has been authorized for detection and/or diagnosis of SARS-CoV-2 by FDA under an Emergency Use Authorization (EUA). This EUA will remain in effect (meaning this test can be used) for the duration of  the COVID-19 declaration under Section 564(b)(1) of the Act, 21 U.S.C. section 360bbb-3(b)(1), unless the authorization is terminated or revoked.  Performed at [facility], [address] [place], [place] [address]   Culture, blood (routine x 2)     Status: None (Preliminary result)   Collection Time: 08/20/23  2:38 AM   Specimen: BLOOD  Result Value Ref Range Status   Specimen Description BLOOD RIGHT ARM  Final   Special Requests   Final    BOTTLES DRAWN AEROBIC AND ANAEROBIC Blood Culture results may not be optimal due to an inadequate volume of blood received in culture bottles   Culture   Final    NO GROWTH 3 DAYS Performed at [facility], [address] [place], [place] [address]    Report Status PENDING  Incomplete  Urine Culture     Status: None   Collection Time: 08/20/23  3:33 AM   Specimen: In/Out Cath Urine  Result Value Ref Range Status   Specimen Description   Final    IN/OUT CATH URINE Performed at [facility], [address] [place], [place] [address]    Special Requests   Final    NONE Performed at [facility], [address], [place], [place] [address]    Culture   Final    NO GROWTH Performed at [facility], [address], [place], [place] [address]    Report  Status 08/21/2023 FINAL  Final  Culture, blood (routine x 2)     Status: None (Preliminary result)   Collection Time: 08/20/23  6:56 AM   Specimen: BLOOD LEFT HAND  Result Value Ref Range Status   Specimen Description BLOOD LEFT HAND  Final   Special Requests   Final    BOTTLES DRAWN AEROBIC AND ANAEROBIC Blood Culture results may not be optimal due to an inadequate volume of blood received in culture bottles   Culture   Final    NO GROWTH 3 DAYS Performed at [facility], [address], [place], [place] [address]    Report Status PENDING  Incomplete    Procedures and diagnostic studies:  No results found.             LOS: 0 days   Jared Whorley  [facility]   Pager on [email or web address]. If 7PM-7AM, please contact night-coverage at [email or web address]     08/23/2023, 12:17 PM

## 2023-08-24 DIAGNOSIS — R531 Weakness: Secondary | ICD-10-CM | POA: Diagnosis not present

## 2023-08-24 LAB — CBC WITH DIFFERENTIAL/PLATELET
Abs Immature Granulocytes: 0.02 10*3/uL (ref 0.00–0.07)
Basophils Absolute: 0.1 10*3/uL (ref 0.0–0.1)
Basophils Relative: 1 %
Eosinophils Absolute: 0.1 10*3/uL (ref 0.0–0.5)
Eosinophils Relative: 1 %
HCT: 34.5 % — ABNORMAL LOW (ref 36.0–46.0)
Hemoglobin: 11.8 g/dL — ABNORMAL LOW (ref 12.0–15.0)
Immature Granulocytes: 0 %
Lymphocytes Relative: 8 %
Lymphs Abs: 0.7 10*3/uL (ref 0.7–4.0)
MCH: 32.4 pg (ref 26.0–34.0)
MCHC: 34.2 g/dL (ref 30.0–36.0)
MCV: 94.8 fL (ref 80.0–100.0)
Monocytes Absolute: 1 10*3/uL (ref 0.1–1.0)
Monocytes Relative: 10 %
Neutro Abs: 7.3 10*3/uL (ref 1.7–7.7)
Neutrophils Relative %: 80 %
Platelets: 272 10*3/uL (ref 150–400)
RBC: 3.64 MIL/uL — ABNORMAL LOW (ref 3.87–5.11)
RDW: 12.9 % (ref 11.5–15.5)
WBC: 9.1 10*3/uL (ref 4.0–10.5)
nRBC: 0 % (ref 0.0–0.2)

## 2023-08-24 LAB — BASIC METABOLIC PANEL WITH GFR
Anion gap: 9 (ref 5–15)
BUN: 16 mg/dL (ref 8–23)
CO2: 23 mmol/L (ref 22–32)
Calcium: 9.1 mg/dL (ref 8.9–10.3)
Chloride: 105 mmol/L (ref 98–111)
Creatinine, Ser: 0.64 mg/dL (ref 0.44–1.00)
GFR, Estimated: >60 mL/min
Glucose, Bld: 97 mg/dL (ref 70–99)
Potassium: 3.7 mmol/L (ref 3.5–5.1)
Sodium: 137 mmol/L (ref 135–145)

## 2023-08-24 MED ORDER — GUAIFENESIN-DM 100-10 MG/5ML PO SYRP
5.0000 mL | ORAL_SOLUTION | Freq: Four times a day (QID) | ORAL | Status: DC | PRN
  Administered 2023-08-24: 5 mL via ORAL
  Filled 2023-08-24: qty 10

## 2023-08-24 NOTE — Plan of Care (Signed)
   Problem: Clinical Measurements: Goal: Will remain free from infection Outcome: Progressing   Problem: Clinical Measurements: Goal: Respiratory complications will improve Outcome: Progressing

## 2023-08-24 NOTE — Progress Notes (Signed)
Progress Note    GABBIE MARZO  WUJ:[id number] DOB: [birth year]  DOA: 08/20/2023 PCP: Marguarite Arbour, MD      Brief Narrative:    Medical records reviewed and are as summarized below:  CORITA ALLINSON is a 89 y.o. 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 Patient has been noted to have worsening/progression in dementia symptoms. She has become less active. She continues to mobilize with a rolling walker but spends most of her days sitting in a chair with her legs elevated. Daughter reports patient having difficulty swallowing larger pills. She has developed urinary incontinence and now requires depends continuously. Daughter verbalizes concerns over inability to care for her mother as she becomes progressively weaker. Daughter also states she has a husband with cancer at home who she is primary caretaker of his well.    01/27: Admitted to hospitalist service for supportive care for RSV, AMS/weakness, family seeking placement, PT/OT eval, recommendations for SNF 01/28: SNF pending        Assessment/Plan:   Principal Problem:   Generalized weakness Active Problems:   Protein-calorie malnutrition, severe   Nutrition Problem: Severe Malnutrition Etiology: chronic illness (dementia)  Signs/Symptoms: severe muscle depletion, severe fat depletion   Body mass index is 23.78 kg/m.   Altered mental status/metabolic encephalopathy due to acute RSV infection, complicated by underlying dementia likely progressing. Encephalopathy improved Stable RSV infection.  She is tolerating room air. She had fever (101.1 F) on the night of 08/21/2022.  Continue to monitor. No evidence of UTI and recent urinalysis and culture.  No active disease on chest x-ray from 08/20/2023.   Debility, generalized weakness  PT/OT recommended discharge to SNF.   Awaiting placement.   Difficulty swallowing She was previously evaluated by the speech therapist.  Regular diet and thin liquids recommended at that time. She was reevaluated by speech therapist on 08/24/2023.  Dysphagia 3 diet and nectar thick liquids were recommended.   Severe protein calorie malnutrition Continue nutritional supplements   Slight elevation in troponin Due to demand ischemia Trend is flat and downward No additional workup indicated   Hypertension At rest patient blood pressure within normal limits but when orthostatic vital signs were taken BP actually increased Continue low-dose lisinopril. Hold Lasix for now-according to daughter was on 20 mg every other day Cardiologist recently took her off of beta-blocker   Idiopathic cardiomyopathy EF 45 to 50% Medications as above Patient does have some mild lower extremity edema at home and per recommendation of cardiologist Lasix had been decreased to every other day and was primarily being used for edema, suspect the edema is likely related to low protein   Chronic atrial fibrillation Currently rate controlled although apparently had tachycardia on presentation No longer on beta-blocker for rate control Continue low-dose Eliquis           Diet Order             DIET DYS 3 Room service appropriate? Yes with Assist; Fluid consistency: Nectar Thick  Diet effective now                            Consultants: None  Procedures: None    Medications:    apixaban  2.5 mg Oral BID   feeding supplement  237 mL Oral BID BM   lisinopril  2.5 mg Oral  Daily   multivitamin with minerals  1 tablet Oral Daily   sodium chloride flush  3 mL Intravenous Q12H   vitamin B-12  100 mcg Oral Daily   Continuous Infusions:   Anti-infectives (From admission, onward)    Start     Dose/Rate Route Frequency Ordered Stop   08/21/23 1200  nitrofurantoin (MACRODANTIN) capsule 50 mg  Status:  Discontinued         50 mg Oral Daily 08/20/23 1416 08/22/23 1445              Family Communication/Anticipated D/C date and plan/Code Status   DVT prophylaxis: apixaban (ELIQUIS) tablet 2.5 mg Start: 08/20/23 2200 apixaban (ELIQUIS) tablet 2.5 mg     Code Status: Do not attempt resuscitation (DNR) PRE-ARREST INTERVENTIONS DESIRED  Family Communication: Plan discussed with [name], daughter, over the phone Disposition Plan: Plan to discharge to SNF   Status is: Observation The patient will require care spanning > 2 midnights and should be moved to inpatient because: Awaiting placement to SNF       Subjective:   Interval events noted.  No fever overnight.  She has no complaints.  Objective:    Vitals:   08/23/23 2150 08/23/23 2301 08/24/23 0731 08/24/23 1502  BP: 116/81 108/63 107/68 114/72  Pulse:  82 64 79  Resp:  18 17 16   Temp:  97.7 F (36.5 C) 98.5 F (36.9 C) 98.8 F (37.1 C)  TempSrc:      SpO2:   97% 96%  Weight:      Height:       No data found.   Intake/Output Summary (Last 24 hours) at 08/24/2023 1517 Last data filed at 08/24/2023 0500 Gross per 24 hour  Intake --  Output 200 ml  Net -200 ml   Filed Weights   08/19/23 1927  Weight: 59 kg    Exam:  GEN: NAD SKIN: Warm and dry EYES: No pallor or icterus ENT: MMM CV: RRR PULM: CTA B ABD: soft, ND, NT, +BS CNS: AAO x 2 (person and place), non focal EXT: No edema or tenderness     Data Reviewed:   I have personally reviewed following labs and imaging studies:  Labs: Labs show the following:   Basic Metabolic Panel: Recent Labs  Lab 08/20/23 0047 08/21/23 0346 08/24/23 0249  NA 138 137 137  K 3.8 3.5 3.7  CL 107 109 105  CO2 22 22 23   GLUCOSE 93 87 97  BUN 30* 18 16  CREATININE 0.62 0.54 0.64  CALCIUM 9.6 8.5* 9.1   GFR Estimated Creatinine Clearance: 34 mL/min (by C-G formula based on SCr of 0.64 mg/dL). Liver Function Tests: Recent Labs  Lab 08/20/23 0047  AST 27  ALT 22   ALKPHOS 75  BILITOT 0.7  PROT 5.9*  ALBUMIN 3.3*   No results for input(s): "LIPASE", "AMYLASE" in the last 168 hours. No results for input(s): "AMMONIA" in the last 168 hours. Coagulation profile No results for input(s): "INR", "PROTIME" in the last 168 hours.  CBC: Recent Labs  Lab 08/20/23 0047 08/21/23 0346 08/24/23 0249  WBC 6.3 5.0 9.1  NEUTROABS  --   --  7.3  HGB 12.1 10.9* 11.8*  HCT 36.0 32.5* 34.5*  MCV 97.6 97.9 94.8  PLT 260 232 272   Cardiac Enzymes: No results for input(s): "CKTOTAL", "CKMB", "CKMBINDEX", "TROPONINI" in the last 168 hours. BNP (last 3 results) No results for input(s): "PROBNP" in the last 8760 hours. CBG: No results  for input(s): "GLUCAP" in the last 168 hours. D-Dimer: No results for input(s): "DDIMER" in the last 72 hours. Hgb A1c: No results for input(s): "HGBA1C" in the last 72 hours. Lipid Profile: No results for input(s): "CHOL", "HDL", "LDLCALC", "TRIG", "CHOLHDL", "LDLDIRECT" in the last 72 hours. Thyroid function studies: No results for input(s): "TSH", "T4TOTAL", "T3FREE", "THYROIDAB" in the last 72 hours.  Invalid input(s): "FREET3" Anemia work up: No results for input(s): "VITAMINB12", "FOLATE", "FERRITIN", "TIBC", "IRON", "RETICCTPCT" in the last 72 hours. Sepsis Labs: Recent Labs  Lab 08/20/23 0047 08/20/23 0238 08/20/23 0650 08/21/23 0346 08/24/23 0249  WBC 6.3  --   --  5.0 9.1  LATICACIDVEN  --  0.7 0.8  --   --     Microbiology Recent Results (from the past 240 hours)  Resp panel by RT-PCR (RSV, Flu A&B, Covid) Anterior Nasal Swab     Status: Abnormal   Collection Time: 08/19/23  7:34 PM   Specimen: Anterior Nasal Swab  Result Value Ref Range Status   SARS Coronavirus 2 by RT PCR NEGATIVE NEGATIVE Final    Comment: (NOTE) SARS-CoV-2 target nucleic acids are NOT DETECTED.  The SARS-CoV-2 RNA is generally detectable in upper respiratory specimens during the acute phase of infection. The  lowest concentration of SARS-CoV-2 viral copies this assay can detect is 138 copies/mL. A negative result does not preclude SARS-Cov-2 infection and should not be used as the sole basis for treatment or other patient management decisions. A negative result may occur with  improper specimen collection/handling, submission of specimen other than nasopharyngeal swab, presence of viral mutation(s) within the areas targeted by this assay, and inadequate number of viral copies(<138 copies/mL). A negative result must be combined with clinical observations, patient history, and epidemiological information. The expected result is Negative.  Fact Sheet for Patients:  BloggerCourse.com  Fact Sheet for Healthcare Providers:  SeriousBroker.it  This test is no t yet approved or cleared by the [place] FDA and  has been authorized for detection and/or diagnosis of SARS-CoV-2 by FDA under an Emergency Use Authorization (EUA). This EUA will remain  in effect (meaning this test can be used) for the duration of the COVID-19 declaration under Section 564(b)(1) of the Act, 21 U.S.C.section 360bbb-3(b)(1), unless the authorization is terminated  or revoked sooner.       Influenza A by PCR NEGATIVE NEGATIVE Final   Influenza B by PCR NEGATIVE NEGATIVE Final    Comment: (NOTE) The Xpert Xpress SARS-CoV-2/FLU/RSV plus assay is intended as an aid in the diagnosis of influenza from Nasopharyngeal swab specimens and should not be used as a sole basis for treatment. Nasal washings and aspirates are unacceptable for Xpert Xpress SARS-CoV-2/FLU/RSV testing.  Fact Sheet for Patients: BloggerCourse.com  Fact Sheet for Healthcare Providers: SeriousBroker.it  This test is not yet approved or cleared by the [place] FDA and has been authorized for detection and/or diagnosis of SARS-CoV-2 by FDA under  an Emergency Use Authorization (EUA). This EUA will remain in effect (meaning this test can be used) for the duration of the COVID-19 declaration under Section 564(b)(1) of the Act, 21 U.S.C. section 360bbb-3(b)(1), unless the authorization is terminated or revoked.     Resp Syncytial Virus by PCR POSITIVE (A) NEGATIVE Final    Comment: (NOTE) Fact Sheet for Patients: BloggerCourse.com  Fact Sheet for Healthcare Providers: SeriousBroker.it  This test is not yet approved or cleared by the [place] FDA and has been authorized for detection and/or diagnosis of SARS-CoV-2  by FDA under an Emergency Use Authorization (EUA). This EUA will remain in effect (meaning this test can be used) for the duration of the COVID-19 declaration under Section 564(b)(1) of the Act, 21 U.S.C. section 360bbb-3(b)(1), unless the authorization is terminated or revoked.  Performed at [facility], [address] [place], [place] [address]   Culture, blood (routine x 2)     Status: None (Preliminary result)   Collection Time: 08/20/23  2:38 AM   Specimen: BLOOD  Result Value Ref Range Status   Specimen Description BLOOD RIGHT ARM  Final   Special Requests   Final    BOTTLES DRAWN AEROBIC AND ANAEROBIC Blood Culture results may not be optimal due to an inadequate volume of blood received in culture bottles   Culture   Final    NO GROWTH 4 DAYS Performed at [facility], [address], [place], [place] [address]    Report Status PENDING  Incomplete  Urine Culture     Status: None   Collection Time: 08/20/23  3:33 AM   Specimen: In/Out Cath Urine  Result Value Ref Range Status   Specimen Description   Final    IN/OUT CATH URINE Performed at [facility], [address], [place], [place] [address]    Special Requests   Final    NONE Performed at [facility], [address] [place], [place] [address]     Culture   Final    NO GROWTH Performed at [facility], [address], [place], [place] [address]    Report Status 08/21/2023 FINAL  Final  Culture, blood (routine x 2)     Status: None (Preliminary result)   Collection Time: 08/20/23  6:56 AM   Specimen: BLOOD LEFT HAND  Result Value Ref Range Status   Specimen Description BLOOD LEFT HAND  Final   Special Requests   Final    BOTTLES DRAWN AEROBIC AND ANAEROBIC Blood Culture results may not be optimal due to an inadequate volume of blood received in culture bottles   Culture   Final    NO GROWTH 4 DAYS Performed at [facility], [address] [place], [place] [address]    Report Status PENDING  Incomplete    Procedures and diagnostic studies:  No results found.             LOS: 0 days   Diamond Martucci  [facility]   Pager on [email or web address]. If 7PM-7AM, please contact night-coverage at [email or web address]     08/24/2023, 3:17 PM

## 2023-08-24 NOTE — Plan of Care (Signed)

## 2023-08-24 NOTE — Progress Notes (Signed)
Speech Language Pathology Treatment: Dysphagia  Patient Details Name: Amy [name] MRN: [id number] DOB: 12-22-1926 Today's Date: 08/24/2023 Time: 6644-0347 SLP Time Calculation (min) (ACUTE ONLY): 40 min  Assessment / Plan / Recommendation Clinical Impression  Pt seen for ongoing reassessment of swallowing. Nursing reported an episode of "choking" when drinking liquids again. In speaking w/ pt's Amy [name] on the phone, it was reported this occurs inconsistently at home as well. Per Amy [name] note: pt has baseline h/o Dementia, Cognitive decline.  Pt was awake/alert and able to follow along in conversation and w/ basic commands w/ gentle cue. She c/o her HAs buzzing/humming -- pt's Amy [name] is on the way into the hospital; encouraged the pt to have the Amy [name] look at them then. Pt agreed.  Pt on RA, afebrile. WBC wnl currently.   Pt appears to present w/ suspected pharyngeal phase dysphagia w/ overt clinical s/s of aspiration noted when drinking thin liquids despite general aspiration precautions -- pt endorsed coughing w/ liquids occurs "sometimes" at home also. Suspect sensorimotor deficits in setting of Advanced Age and Cognitive decline Baseline.  OF NOTE: Pt consumed po trials of Nectar liquids and mech soft foods w/ No overt, clinical s/s of aspiration during po trials. Pt appears at reduced risk for aspiration when following general aspiration precautions and using a modified, Dysphagia diet.  Pt does have challenging factors that could impact her oropharyngeal swallowing to include hospitalization, deconditioning/weakness, and advanced age as well as Cognitive decline -- ANY Cognitive decline can impact timing/awareness of swallowing. Pt is also limited in her self-feeding abilities d/t weak UEs(OT consulted re: builtup handles for utensils). These factors can increase risk for aspiration, dysphagia as well as decreased oral intake overall.   During po trials, pt mild coughing(delayed and immediate) w/ Cup sips  of thin liquids. W/ trials of Nectar liquids and soft foods, she consumed consistencies w/ no overt coughing, decline in vocal quality, or change in respiratory presentation during/post trials. O2 sats 98%. Oral phase appeared grossly [facility] w/ bolus management, mastication, and control of bolus propulsion for A-P transfer for swallowing. Oral clearing achieved w/ all trial consistencies -- moistened, soft foods given. OM Exam appeared [facility] w/ no unilateral weakness noted. Speech Clear. Pt fed self w/ MOD setup support.   Recommend a Dysphagia level 3(mech soft) consistency diet w/ well-Cut meats, moistened foods; Nectar consistency liquids -- carefully monitor cup drinking, and pt should help to Hold Cup when drinking. Recommend general aspiration precautions, reduced distractions/talking during meals. Tray setup and sitting up support. Feeding support as needed; built-up utensils provided. Pills CRUSHED vs WHOLE in Puree for safer, easier swallowing -- encouraged now and for D/C to the Amy [name].  Education given on Pills in Puree; food consistencies and easy to eat options; swallowing and dysphagia and risk for aspiration; general aspiration precautions; use of a Dysphagia diet at this time to reduce risk for aspiration/aspiration pneumonia to pt and Amy [name]. Both agreed. ST services recommended to f/u at next venue of care for ongoing assessment and management; trials to upgrade if appropriate. No further Acute ST services indicated currently as pt may be at/close to her swallowing baseline w/ mild dysphagia(liquids). Amy [name]/NSG to reconsult if any new needs arise while admitted. NSG updated, agreed. Amy [name] updated. Recommend Dietician and Palliative Care f/u for support.    HPI HPI: Amy [name] is a 89 y.o. female with medical history significant of Dementia, atrial fibrillation on Eliquis, dyslipidemia, hypertension, peripheral vascular disease, idiopathic cardiomyopathy with an EF of  45 to 50%.Upon arrival to the ER she  was normotensive, afebrile.  Viral PCR was positive for RSV.  Chest x-ray unremarkable and no focal infiltrates were noted.  CT Head: No evidence of acute intracranial abnormality.  2. MOD Chronic microvascular ischemic disease.  Pt's dx include RSV infection, protein calorie malnutrition per Amy [name].    OF NOTE: Per Amy [name]'s report, pt has been having mild coughing w/ liquids at home prior to admit.  Pt lives alone w/ Family near to assist.      SLP Plan  All goals met (f/u at next venue of care)      Recommendations for follow up therapy are one component of a multi-disciplinary discharge planning process, led by the attending physician.  Recommendations may be updated based on patient status, additional functional criteria and insurance authorization.    Recommendations  Diet recommendations: Dysphagia 3 (mechanical soft);Nectar-thick liquid Liquids provided via: Cup Medication Administration: Whole meds with puree (vs CRUSHED in Puree) Supervision: Intermittent supervision to cue for compensatory strategies (setup) Compensations: Minimize environmental distractions;Slow rate;Small sips/bites;Lingual sweep for clearance of pocketing;Follow solids with liquid Postural Changes and/or Swallow Maneuvers: Out of bed for meals;Seated upright 90 degrees;Upright 30-60 min after meal                 (Palliative Care consult for GOC; Dietician) Oral care BID;Oral care before and after PO;Staff/trained caregiver to provide oral care (setup for pt)   Intermittent Supervision/Assistance Dysphagia, pharyngeal phase (R13.13) (suspected in setting of advanced age; Dementia)     All goals met (f/u at next venue of care)      Amy Som, MS, CCC-SLP Speech Language Pathologist Rehab Services; [facility] [phone] (ascom) Amy [name]  08/24/2023, 10:31 AM

## 2023-08-24 NOTE — TOC Progression Note (Signed)
Transition of Care [facility]) - Progression Note    Patient Details  Name: Amy [name] MRN: [id number] Date of Birth: October 23, 1926  [facility] CM/SW Contact  Garret Reddish, RN Phone Number: 08/24/2023, 2:10 PM  Clinical Narrative:     Chart reviewed.  I have spoken to patient 's daughter [name].   I have spoken to her about PT recommendation at [facility].  I have informed her of current bed offer.  She would also like for me to expand bed search to [place] area.  I have expanded bed search to the [place] area.    TOC will continue to follow for discharge planning.         Expected Discharge Plan and Services                                               Social Determinants of Health (SDOH) Interventions SDOH Screenings   Food Insecurity: Patient Unable To Answer (08/20/2023)  Housing: Patient Unable To Answer (08/20/2023)  Transportation Needs: Patient Unable To Answer (08/20/2023)  Utilities: Patient Unable To Answer (08/20/2023)  Financial Resource Strain: Low Risk  (07/24/2023)   Received from [facility]  Social Connections: Patient Unable To Answer (08/20/2023)  Tobacco Use: Low Risk  (08/19/2023)    Readmission Risk Interventions     No data to display

## 2023-08-24 NOTE — Progress Notes (Signed)
Occupational Therapy Treatment Patient Details Name: Amy [name] MRN: [id number] DOB: 07/31/1926 Today's Date: 08/24/2023   History of present illness Pt is a 89 y.o. female brought to the ED from home by her daughter with a chief complaint of cough, altered mental status and generalized weakness with recent fall history.  MD assessment includes: altered mental status/underlying dementia/acute RSV infection, protein calorie malnutrition, difficulty swallowing, and slight elevation of troponin due to demand ischemia. 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   OT comments  Amy [name] was seen for OT treatment on this date. Upon arrival to room pt in bed, agreeable to tx. Pt requires MOD A exit bed. SETUP self feeding in sitting with built up handle. Tolerates ~15 min sitting with progressive L lateral lean as pt fatigues. MIN A + RW sit<>stand at EOB, pt appears anxious of steps and quickly returns to sitting. Pt making good progress toward goals, will continue to follow POC. Discharge recommendation remains appropriate.        If plan is discharge home, recommend the following:  A lot of help with bathing/dressing/bathroom;A little help with walking and/or transfers;Direct supervision/assist for medications management;Supervision due to cognitive status;Direct supervision/assist for financial management;Assist for transportation;Assistance with cooking/housework;Help with stairs or ramp for entrance   Equipment Recommendations  BSC/3in1;Hospital bed;Other (comment)    Recommendations for Other Services      Precautions / Restrictions Precautions Precautions: Fall Restrictions Weight Bearing Restrictions Per Provider Order: No       Mobility Bed Mobility Overal bed mobility: Needs Assistance Bed Mobility: Supine to Sit, Sit to Supine     Supine to sit: Mod assist Sit to supine: Min assist        Transfers Overall  transfer level: Needs assistance Equipment used: Rolling walker (2 wheels) Transfers: Sit to/from Stand Sit to Stand: Min assist, From elevated surface                 Balance Overall balance assessment: Needs assistance Sitting-balance support: No upper extremity supported, Feet supported Sitting balance-Leahy Scale: Fair     Standing balance support: Bilateral upper extremity supported, Reliant on assistive device for balance, During functional activity Standing balance-Leahy Scale: Poor                             ADL either performed or assessed with clinical judgement   ADL Overall ADL's : Needs assistance/impaired                                       General ADL Comments: SETUP self feeding in sitting with built up handle. Tolerates ~15 min sitting with progressive L lateral lean as pt fatigues      Cognition Arousal: Alert Behavior During Therapy: WFL for tasks assessed/performed Overall Cognitive Status: History of cognitive impairments - at baseline                                                     Pertinent Vitals/ Pain       Pain Assessment Pain Assessment: No/denies pain   Frequency  Min 1X/week        Progress  Toward Goals  OT Goals(current goals can now be found in the care plan section)  Progress towards OT goals: Progressing toward goals  Acute Rehab OT Goals OT Goal Formulation: With patient/family Time For Goal Achievement: 09/03/23 Potential to Achieve Goals: Good ADL Goals Pt Will Perform Grooming: sitting;with supervision Pt Will Transfer to Toilet: with supervision;with contact guard assist;ambulating;bedside commode;regular height toilet;grab bars Pt Will Perform Toileting - Clothing Manipulation and hygiene: with supervision;sit to/from stand;sitting/lateral leans  Plan      Co-evaluation                 AM-PAC OT "6 Clicks" Daily Activity     Outcome Measure    Help from another person eating meals?: None Help from another person taking care of personal grooming?: A Little Help from another person toileting, which includes using toliet, bedpan, or urinal?: A Lot Help from another person bathing (including washing, rinsing, drying)?: A Lot Help from another person to put on and taking off regular upper body clothing?: A Little Help from another person to put on and taking off regular lower body clothing?: A Lot 6 Click Score: 16    End of Session Equipment Utilized During Treatment: Rolling walker (2 wheels)  OT Visit Diagnosis: Other abnormalities of gait and mobility (R26.89);Unsteadiness on feet (R26.81);Muscle weakness (generalized) (M62.81);History of falling (Z91.81)   Activity Tolerance Patient tolerated treatment well   Patient Left in bed;with call bell/phone within reach;with bed alarm set   Nurse Communication          Time: 8295-6213 OT Time Calculation (min): 28 min  Charges: OT General Charges $OT Visit: 1 Visit OT Treatments $Self Care/Home Management : 23-37 mins  Kathie Dike, M.S. OTR/L  08/24/23, 10:33 AM  ascom [phone]

## 2023-08-25 DIAGNOSIS — R531 Weakness: Secondary | ICD-10-CM | POA: Diagnosis not present

## 2023-08-25 LAB — CULTURE, BLOOD (ROUTINE X 2)
Culture: NO GROWTH
Culture: NO GROWTH

## 2023-08-25 NOTE — Progress Notes (Addendum)
Progress Note    Amy Amy [name]  ZOX:[id number] DOB: 28-Jan-1927  DOA: 08/20/2023 PCP: Marguarite Arbour, MD      Brief Narrative:    Medical records reviewed and are as summarized below:  Amy Amy [name] is a 89 y.o. 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 Amy [name] has been noted to have worsening/progression in dementia symptoms. She has become less active. She continues to mobilize with a rolling walker but spends most of her days sitting in a chair with her legs elevated. Daughter reports Amy [name] having difficulty swallowing larger pills. She has developed urinary incontinence and now requires depends continuously. Daughter verbalizes concerns over inability to care for her mother as she becomes progressively weaker. Daughter also states she has a husband with cancer at home who she is primary caretaker of his well.    01/27: Admitted to hospitalist service for supportive care for RSV, AMS/weakness, family seeking placement, PT/OT eval, recommendations for SNF 01/28: SNF pending        Assessment/Plan:   Principal Problem:   Generalized weakness Active Problems:   Protein-calorie malnutrition, severe   Nutrition Problem: Severe Malnutrition Etiology: chronic illness (dementia)  Signs/Symptoms: severe muscle depletion, severe fat depletion   Body mass index is 23.78 kg/m.   Altered mental status/metabolic encephalopathy due to acute RSV infection, complicated by underlying dementia likely progressing. Encephalopathy improved Stable RSV infection.  She is tolerating room air. No more fevers.  She has been afebrile for more than 60 hours. No evidence of UTI and recent urinalysis and culture.  No active disease on chest x-ray from 08/20/2023.   Debility, generalized weakness  PT/OT recommended discharge to SNF.  Awaiting  placement.   Difficulty swallowing She was previously evaluated by Amy speech therapist.  Regular diet and thin liquids recommended at that time. She was reevaluated by speech therapist on 08/24/2023.  Dysphagia 3 diet and nectar thick liquids were recommended.   Severe protein calorie malnutrition Continue nutritional supplements   Slight elevation in troponin Due to demand ischemia Trend is flat and downward No additional workup indicated   Hypertension At rest Amy [name] blood pressure within normal limits but when orthostatic vital signs were taken BP actually increased Continue low-dose lisinopril. Hold Lasix for now-according to daughter was on 20 mg every other day Cardiologist recently took her off of beta-blocker   Idiopathic cardiomyopathy EF 45 to 50% Medications as above Amy [name] does have some mild lower extremity edema at home and per recommendation of cardiologist Lasix had been decreased to every other day and was primarily being used for edema, suspect Amy edema is likely related to low protein   Chronic atrial fibrillation Currently rate controlled although apparently had tachycardia on presentation No longer on beta-blocker for rate control Continue low-dose Eliquis           Diet Order             DIET DYS 3 Room service appropriate? Yes with Assist; Fluid consistency: Nectar Thick  Diet effective now                            Consultants: None  Procedures: None    Medications:    apixaban  2.5 mg Oral BID   feeding supplement  237 mL Oral BID BM   lisinopril  2.5 mg Oral Daily  multivitamin with minerals  1 tablet Oral Daily   sodium chloride flush  3 mL Intravenous Q12H   vitamin B-12  100 mcg Oral Daily   Continuous Infusions:   Anti-infectives (From admission, onward)    Start     Dose/Rate Route Frequency Ordered Stop   08/21/23 1200  nitrofurantoin (MACRODANTIN) capsule 50 mg  Status:  Discontinued        50 mg  Oral Daily 08/20/23 1416 08/22/23 1445              Family Communication/Anticipated D/C date and plan/Code Status   DVT prophylaxis: apixaban (ELIQUIS) tablet 2.5 mg Start: 08/20/23 2200 apixaban (ELIQUIS) tablet 2.5 mg     Code Status: Do not attempt resuscitation (DNR) PRE-ARREST INTERVENTIONS DESIRED  Family Communication: None Disposition Plan: Plan to discharge to SNF   Status is: Observation Amy Amy [name] will require care spanning > 2 midnights and should be moved to inpatient because: Awaiting placement to SNF       Subjective:   No acute events overnight.  She said she did not sleep well last night.  No other complaints.  Objective:    Vitals:   08/24/23 0731 08/24/23 1502 08/24/23 2130 08/25/23 0743  BP: 107/68 114/72 118/71 114/73  Pulse: 64 79 98 60  Resp: 17 16 18 16   Temp: 98.5 F (36.9 C) 98.8 F (37.1 C) 98.2 F (36.8 C) 98.1 F (36.7 C)  TempSrc:    Oral  SpO2: 97% 96% 95% 98%  Weight:      Height:       No data found.   Intake/Output Summary (Last 24 hours) at 08/25/2023 1355 Last data filed at 08/25/2023 0420 Gross per 24 hour  Intake --  Output 600 ml  Net -600 ml   Filed Weights   08/19/23 1927  Weight: 59 kg    Exam:  GEN: NAD SKIN: Warm and dry EYES: No pallor or icterus ENT: MMM CV: RRR PULM: CTA B ABD: soft, ND, NT, +BS CNS: AAO x 1 (person), non focal EXT: No edema or tenderness     Data Reviewed:   I have personally reviewed following labs and imaging studies:  Labs: Labs show Amy following:   Basic Metabolic Panel: Recent Labs  Lab 08/20/23 0047 08/21/23 0346 08/24/23 0249  NA 138 137 137  K 3.8 3.5 3.7  CL 107 109 105  CO2 22 22 23   GLUCOSE 93 87 97  BUN 30* 18 16  CREATININE 0.62 0.54 0.64  CALCIUM 9.6 8.5* 9.1   GFR Estimated Creatinine Clearance: 34 mL/min (by C-G formula based on SCr of 0.64 mg/dL). Liver Function Tests: Recent Labs  Lab 08/20/23 0047  AST 27  ALT 22  ALKPHOS  75  BILITOT 0.7  PROT 5.9*  ALBUMIN 3.3*   No results for input(s): "LIPASE", "AMYLASE" in Amy last 168 hours. No results for input(s): "AMMONIA" in Amy last 168 hours. Coagulation profile No results for input(s): "INR", "PROTIME" in Amy last 168 hours.  CBC: Recent Labs  Lab 08/20/23 0047 08/21/23 0346 08/24/23 0249  WBC 6.3 5.0 9.1  NEUTROABS  --   --  7.3  HGB 12.1 10.9* 11.8*  HCT 36.0 32.5* 34.5*  MCV 97.6 97.9 94.8  PLT 260 232 272   Cardiac Enzymes: No results for input(s): "CKTOTAL", "CKMB", "CKMBINDEX", "TROPONINI" in Amy last 168 hours. BNP (last 3 results) No results for input(s): "PROBNP" in Amy last 8760 hours. CBG: No results for input(s): "GLUCAP"  in Amy last 168 hours. D-Dimer: No results for input(s): "DDIMER" in Amy last 72 hours. Hgb A1c: No results for input(s): "HGBA1C" in Amy last 72 hours. Lipid Profile: No results for input(s): "CHOL", "HDL", "LDLCALC", "TRIG", "CHOLHDL", "LDLDIRECT" in Amy last 72 hours. Thyroid function studies: No results for input(s): "TSH", "T4TOTAL", "T3FREE", "THYROIDAB" in Amy last 72 hours.  Invalid input(s): "FREET3" Anemia work up: No results for input(s): "VITAMINB12", "FOLATE", "FERRITIN", "TIBC", "IRON", "RETICCTPCT" in Amy last 72 hours. Sepsis Labs: Recent Labs  Lab 08/20/23 0047 08/20/23 0238 08/20/23 0650 08/21/23 0346 08/24/23 0249  WBC 6.3  --   --  5.0 9.1  LATICACIDVEN  --  0.7 0.8  --   --     Microbiology Recent Results (from Amy past 240 hours)  Resp panel by RT-PCR (RSV, Flu A&B, Covid) Anterior Nasal Swab     Status: Abnormal   Collection Time: 08/19/23  7:34 PM   Specimen: Anterior Nasal Swab  Result Value Ref Range Status   SARS Coronavirus 2 by RT PCR NEGATIVE NEGATIVE Final    Comment: (NOTE) SARS-CoV-2 target nucleic acids are NOT DETECTED.  Amy SARS-CoV-2 RNA is generally detectable in upper respiratory specimens during Amy acute phase of infection. Amy lowest concentration of  SARS-CoV-2 viral copies this assay can detect is 138 copies/mL. A negative result does not preclude SARS-Cov-2 infection and should not be used as Amy sole basis for treatment or other Amy [name] management decisions. A negative result may occur with  improper specimen collection/handling, submission of specimen other than nasopharyngeal swab, presence of viral mutation(s) within Amy areas targeted by this assay, and inadequate number of viral copies(<138 copies/mL). A negative result must be combined with clinical observations, Amy [name] history, and epidemiological information. Amy expected result is Negative.  Fact Sheet for Patients:  BloggerCourse.com  Fact Sheet for Healthcare Providers:  SeriousBroker.it  This test is no t yet approved or cleared by Amy [place] FDA and  has been authorized for detection and/or diagnosis of SARS-CoV-2 by FDA under an Emergency Use Authorization (EUA). This EUA will remain  in effect (meaning this test can be used) for Amy duration of Amy COVID-19 declaration under Section 564(b)(1) of Amy Act, 21 U.S.C.section 360bbb-3(b)(1), unless Amy authorization is terminated  or revoked sooner.       Influenza A by PCR NEGATIVE NEGATIVE Final   Influenza B by PCR NEGATIVE NEGATIVE Final    Comment: (NOTE) Amy Xpert Xpress SARS-CoV-2/FLU/RSV plus assay is intended as an aid in Amy diagnosis of influenza from Nasopharyngeal swab specimens and should not be used as a sole basis for treatment. Nasal washings and aspirates are unacceptable for Xpert Xpress SARS-CoV-2/FLU/RSV testing.  Fact Sheet for Patients: BloggerCourse.com  Fact Sheet for Healthcare Providers: SeriousBroker.it  This test is not yet approved or cleared by Amy [place] FDA and has been authorized for detection and/or diagnosis of SARS-CoV-2 by FDA under an Emergency Use  Authorization (EUA). This EUA will remain in effect (meaning this test can be used) for Amy duration of Amy COVID-19 declaration under Section 564(b)(1) of Amy Act, 21 U.S.C. section 360bbb-3(b)(1), unless Amy authorization is terminated or revoked.     Resp Syncytial Virus by PCR POSITIVE (A) NEGATIVE Final    Comment: (NOTE) Fact Sheet for Patients: BloggerCourse.com  Fact Sheet for Healthcare Providers: SeriousBroker.it  This test is not yet approved or cleared by Amy [place] FDA and has been authorized for detection and/or diagnosis of SARS-CoV-2 by FDA under  an Emergency Use Authorization (EUA). This EUA will remain in effect (meaning this test can be used) for Amy duration of Amy COVID-19 declaration under Section 564(b)(1) of Amy Act, 21 U.S.C. section 360bbb-3(b)(1), unless Amy authorization is terminated or revoked.  Performed at [facility], [address] [place], [place] [address]   Culture, blood (routine x 2)     Status: None   Collection Time: 08/20/23  2:38 AM   Specimen: BLOOD  Result Value Ref Range Status   Specimen Description BLOOD RIGHT ARM  Final   Special Requests   Final    BOTTLES DRAWN AEROBIC AND ANAEROBIC Blood Culture results may not be optimal due to an inadequate volume of blood received in culture bottles   Culture   Final    NO GROWTH 5 DAYS Performed at [facility], [address] [place], [place] [address]    Report Status 08/25/2023 FINAL  Final  Urine Culture     Status: None   Collection Time: 08/20/23  3:33 AM   Specimen: In/Out Cath Urine  Result Value Ref Range Status   Specimen Description   Final    IN/OUT CATH URINE Performed at [facility], [address], [place], [place] [address]    Special Requests   Final    NONE Performed at [facility], [address], [place], [place] [address]    Culture   Final    NO  GROWTH Performed at [facility], [address], [place], [place] [address]    Report Status 08/21/2023 FINAL  Final  Culture, blood (routine x 2)     Status: None   Collection Time: 08/20/23  6:56 AM   Specimen: BLOOD LEFT HAND  Result Value Ref Range Status   Specimen Description BLOOD LEFT HAND  Final   Special Requests   Final    BOTTLES DRAWN AEROBIC AND ANAEROBIC Blood Culture results may not be optimal due to an inadequate volume of blood received in culture bottles   Culture   Final    NO GROWTH 5 DAYS Performed at [facility], [address] [place], [place] [address]    Report Status 08/25/2023 FINAL  Final    Procedures and diagnostic studies:  No results found.             LOS: 0 days   Hildred Pharo  [facility]   Pager on [email or web address]. If 7PM-7AM, please contact night-coverage at [email or web address]     08/25/2023, 1:55 PM

## 2023-08-25 NOTE — Plan of Care (Signed)
  Problem: Education: Goal: Knowledge of General Education information will improve Description: Including pain rating scale, medication(s)/side effects and non-pharmacologic comfort measures Outcome: Progressing   Problem: Health Behavior/Discharge Planning: Goal: Ability to manage health-related needs will improve Outcome: Progressing   Problem: Clinical Measurements: Goal: Will remain free from infection Outcome: Progressing Goal: Diagnostic test results will improve Outcome: Progressing Goal: Cardiovascular complication will be avoided Outcome: Progressing   Problem: Activity: Goal: Risk for activity intolerance will decrease Outcome: Progressing   Problem: Nutrition: Goal: Adequate nutrition will be maintained Outcome: Progressing   Problem: Elimination: Goal: Will not experience complications related to bowel motility Outcome: Progressing   Problem: Safety: Goal: Ability to remain free from injury will improve Outcome: Progressing   Problem: Skin Integrity: Goal: Risk for impaired skin integrity will decrease Outcome: Progressing

## 2023-08-25 NOTE — Progress Notes (Signed)
Physical Therapy Treatment Patient Details Name: Amy [name] MRN: [id number] DOB: 01-Mar-1927 Today's Date: 08/25/2023   History of Present Illness Pt is a 89 y.o. female brought to the ED from home by her daughter with a chief complaint of cough, altered mental status and generalized weakness with recent fall history.  MD assessment includes: altered mental status/underlying dementia/acute RSV infection, protein calorie malnutrition, difficulty swallowing, and slight elevation of troponin due to demand ischemia. 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    PT Comments  Patient alert with tech at bedside. Agreeable to mobility but emotional during session. Supine to sit with modA and extra time, mulitmodal cueing to increase participation. Able to sit EOB for most of session including to eat a few more bites. Sit <> stand with RW requiring minA for lift as well as steadying. ModA for sidestepping. Returned to supine with modA as well. The patient would benefit from further skilled PT intervention to continue to progress towards goals.    If plan is discharge home, recommend the following: Two people to help with walking and/or transfers;A lot of help with bathing/dressing/bathroom;Assistance with cooking/housework;Direct supervision/assist for medications management;Assist for transportation;Help with stairs or ramp for entrance;Direct supervision/assist for financial management;Supervision due to cognitive status   Can travel by private vehicle     No  Equipment Recommendations  Other (comment) (TBD at nxt venue of care)    Recommendations for Other Services       Precautions / Restrictions Precautions Precautions: Fall Restrictions Weight Bearing Restrictions Per Provider Order: No     Mobility  Bed Mobility Overal bed mobility: Needs Assistance Bed Mobility: Supine to Sit     Supine to sit: Mod assist, HOB elevated,  Used rails Sit to supine: Mod assist        Transfers Overall transfer level: Needs assistance Equipment used: Rolling walker (2 wheels) Transfers: Sit to/from Stand Sit to Stand: Min assist           General transfer comment: minA for two standing trials, multimodal cues for technique and foot placement    Ambulation/Gait Ambulation/Gait assistance: Mod assist Gait Distance (Feet): 2 Feet Assistive device: Rolling walker (2 wheels) Gait Pattern/deviations: Step-to pattern, Decreased step length - right, Decreased step length - left, Trunk flexed, Shuffle       General Gait Details: Pt required mod A for stability and to guide the RW and heavy cuing for sequencing   Stairs             Wheelchair Mobility     Tilt Bed    Modified Rankin (Stroke Patients Only)       Balance Overall balance assessment: Needs assistance Sitting-balance support: No upper extremity supported, Feet supported Sitting balance-Leahy Scale: Fair Sitting balance - Comments: CGA/SBA for seated balance at EOB. able to eat with tech assist for several minutes   Standing balance support: Bilateral upper extremity supported, Reliant on assistive device for balance, During functional activity Standing balance-Leahy Scale: Poor                              Cognition Arousal: Alert Behavior During Therapy: WFL for tasks assessed/performed Overall Cognitive Status: History of cognitive impairments - at baseline  General Comments: oriented to self, labile during session        Exercises      General Comments        Pertinent Vitals/Pain Pain Assessment Pain Assessment: No/denies pain    Home Living                          Prior Function            PT Goals (current goals can now be found in the care plan section) Progress towards PT goals: Progressing toward goals    Frequency    Min  1X/week      PT Plan      Co-evaluation              AM-PAC PT "6 Clicks" Mobility   Outcome Measure  Help needed turning from your back to your side while in a flat bed without using bedrails?: A Little Help needed moving from lying on your back to sitting on the side of a flat bed without using bedrails?: A Little Help needed moving to and from a bed to a chair (including a wheelchair)?: A Lot Help needed standing up from a chair using your arms (e.g., wheelchair or bedside chair)?: A Lot Help needed to walk in hospital room?: Total Help needed climbing 3-5 steps with a railing? : Total 6 Click Score: 12    End of Session   Activity Tolerance: Patient tolerated treatment well Patient left: in bed;with call bell/phone within reach;with bed alarm set;with nursing/sitter in room Nurse Communication: Mobility status PT Visit Diagnosis: Unsteadiness on feet (R26.81);History of falling (Z91.81);Difficulty in walking, not elsewhere classified (R26.2);Muscle weakness (generalized) (M62.81)     Time: 8413-2440 PT Time Calculation (min) (ACUTE ONLY): 14 min  Charges:    $Therapeutic Activity: 8-22 mins PT General Charges $$ ACUTE PT VISIT: 1 Visit                     Olga Coaster PT, DPT 3:23 PM,08/25/23

## 2023-08-26 DIAGNOSIS — R531 Weakness: Secondary | ICD-10-CM | POA: Diagnosis not present

## 2023-08-26 NOTE — TOC Progression Note (Signed)
Transition of Care [facility]) - Progression Note    Patient Details  Name: Amy [name] MRN: [id number] Date of Birth: [birth year]  Transition of Care [facility]) CM/SW Contact  Maree Krabbe, LCSW Phone Number: 08/26/2023, 9:07 AM  Clinical Narrative:   SW called daughter to discuss bed offers. She chooses [facility]. SW will start auth.         Expected Discharge Plan and Services                                               Social Determinants of Health (SDOH) Interventions SDOH Screenings   Food Insecurity: Patient Unable To Answer (08/20/2023)  Housing: Patient Unable To Answer (08/20/2023)  Transportation Needs: Patient Unable To Answer (08/20/2023)  Utilities: Patient Unable To Answer (08/20/2023)  Financial Resource Strain: Low Risk  (07/24/2023)   Received from [facility]  Social Connections: Patient Unable To Answer (08/20/2023)  Tobacco Use: Low Risk  (08/19/2023)    Readmission Risk Interventions     No data to display

## 2023-08-26 NOTE — Progress Notes (Signed)
Progress Note    Amy PFALZGRAF  [name]:[id number] DOB: 09/09/1926  DOA: 08/20/2023 PCP: Marguarite Arbour, MD      Brief Narrative:    Medical records reviewed and are as summarized below:  Amy [name] is a 89 y.o. 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 Patient has been noted to have worsening/progression in dementia symptoms. She has become less active. She continues to mobilize with a rolling walker but spends most of her days sitting in a chair with her legs elevated. Daughter reports patient having difficulty swallowing larger pills. She has developed urinary incontinence and now requires depends continuously. Daughter verbalizes concerns over inability to care for her mother as she becomes progressively weaker. Daughter also states she has a husband with cancer at home who she is primary caretaker of his well.    01/27: Admitted to hospitalist service for supportive care for RSV, AMS/weakness, family seeking placement, PT/OT eval, recommendations for SNF 01/28: SNF pending        Assessment/Plan:   Principal Problem:   Generalized weakness Active Problems:   Protein-calorie malnutrition, severe   Nutrition Problem: Severe Malnutrition Etiology: chronic illness (dementia)  Signs/Symptoms: severe muscle depletion, severe fat depletion   Body mass index is 23.78 kg/m.   Altered mental status/metabolic encephalopathy due to acute RSV infection, complicated by underlying dementia likely progressing. Encephalopathy improved Stable RSV infection.  She is tolerating room air. No more fever.  She has been afebrile for more than 72 hours. No evidence of UTI and recent urinalysis and culture.  No active disease on chest x-ray from 08/20/2023.   Debility, generalized weakness  PT/OT recommended discharge to SNF.  Awaiting  placement.   Difficulty swallowing She was previously evaluated by the speech therapist.  Regular diet and thin liquids recommended at that time. She was reevaluated by speech therapist on 08/24/2023.  Dysphagia 3 diet and nectar thick liquids were recommended.   Severe protein calorie malnutrition Continue nutritional supplements   Slight elevation in troponin Due to demand ischemia Trend is flat and downward No additional workup indicated   Hypertension At rest patient blood pressure within normal limits but when orthostatic vital signs were taken BP actually increased Continue low-dose lisinopril. Hold Lasix for now-according to daughter was on 20 mg every other day Cardiologist recently took her off of beta-blocker   Idiopathic cardiomyopathy EF 45 to 50% Medications as above Patient does have some mild lower extremity edema at home and per recommendation of cardiologist Lasix had been decreased to every other day and was primarily being used for edema, suspect the edema is likely related to low protein   Chronic atrial fibrillation Currently rate controlled although apparently had tachycardia on presentation No longer on beta-blocker for rate control Continue low-dose Eliquis           Diet Order             DIET DYS 3 Room service appropriate? Yes with Assist; Fluid consistency: Nectar Thick  Diet effective now                            Consultants: None  Procedures: None    Medications:    apixaban  2.5 mg Oral BID   feeding supplement  237 mL Oral BID BM   lisinopril  2.5 mg Oral Daily  multivitamin with minerals  1 tablet Oral Daily   sodium chloride flush  3 mL Intravenous Q12H   vitamin B-12  100 mcg Oral Daily   Continuous Infusions:   Anti-infectives (From admission, onward)    Start     Dose/Rate Route Frequency Ordered Stop   08/21/23 1200  nitrofurantoin (MACRODANTIN) capsule 50 mg  Status:  Discontinued        50 mg  Oral Daily 08/20/23 1416 08/22/23 1445              Family Communication/Anticipated D/C date and plan/Code Status   DVT prophylaxis: apixaban (ELIQUIS) tablet 2.5 mg Start: 08/20/23 2200 apixaban (ELIQUIS) tablet 2.5 mg     Code Status: Do not attempt resuscitation (DNR) PRE-ARREST INTERVENTIONS DESIRED  Family Communication: None Disposition Plan: Plan to discharge to SNF   Status is: Observation The patient will require care spanning > 2 midnights and should be moved to inpatient because: Awaiting placement to SNF       Subjective:   Interval events noted.  She has no complaints.  Objective:    Vitals:   08/25/23 2159 08/25/23 2359 08/26/23 0820 08/26/23 1401  BP: 121/68 112/65 132/78 125/82  Pulse: 95 82 (!) 58 94  Resp: 17 18 16 19   Temp: 99 F (37.2 C) 98.2 F (36.8 C) 98.2 F (36.8 C) 98.5 F (36.9 C)  TempSrc:  Oral    SpO2: 92% 94% 93% 98%  Weight:      Height:       No data found.   Intake/Output Summary (Last 24 hours) at 08/26/2023 1550 Last data filed at 08/26/2023 0558 Gross per 24 hour  Intake 240 ml  Output 300 ml  Net -60 ml   Filed Weights   08/19/23 1927  Weight: 59 kg    Exam:  GEN: NAD SKIN: Warm and dry EYES: No pallor or icterus ENT: MMM CV: RRR PULM: CTA B ABD: soft, ND, NT, +BS CNS: AAO x 1 (person), non focal EXT: No edema or tenderness    Data Reviewed:   I have personally reviewed following labs and imaging studies:  Labs: Labs show the following:   Basic Metabolic Panel: Recent Labs  Lab 08/20/23 0047 08/21/23 0346 08/24/23 0249  NA 138 137 137  K 3.8 3.5 3.7  CL 107 109 105  CO2 22 22 23   GLUCOSE 93 87 97  BUN 30* 18 16  CREATININE 0.62 0.54 0.64  CALCIUM 9.6 8.5* 9.1   GFR Estimated Creatinine Clearance: 34 mL/min (by C-G formula based on SCr of 0.64 mg/dL). Liver Function Tests: Recent Labs  Lab 08/20/23 0047  AST 27  ALT 22  ALKPHOS 75  BILITOT 0.7  PROT 5.9*  ALBUMIN 3.3*    No results for input(s): "LIPASE", "AMYLASE" in the last 168 hours. No results for input(s): "AMMONIA" in the last 168 hours. Coagulation profile No results for input(s): "INR", "PROTIME" in the last 168 hours.  CBC: Recent Labs  Lab 08/20/23 0047 08/21/23 0346 08/24/23 0249  WBC 6.3 5.0 9.1  NEUTROABS  --   --  7.3  HGB 12.1 10.9* 11.8*  HCT 36.0 32.5* 34.5*  MCV 97.6 97.9 94.8  PLT 260 232 272   Cardiac Enzymes: No results for input(s): "CKTOTAL", "CKMB", "CKMBINDEX", "TROPONINI" in the last 168 hours. BNP (last 3 results) No results for input(s): "PROBNP" in the last 8760 hours. CBG: No results for input(s): "GLUCAP" in the last 168 hours. D-Dimer: No results for  input(s): "DDIMER" in the last 72 hours. Hgb A1c: No results for input(s): "HGBA1C" in the last 72 hours. Lipid Profile: No results for input(s): "CHOL", "HDL", "LDLCALC", "TRIG", "CHOLHDL", "LDLDIRECT" in the last 72 hours. Thyroid function studies: No results for input(s): "TSH", "T4TOTAL", "T3FREE", "THYROIDAB" in the last 72 hours.  Invalid input(s): "FREET3" Anemia work up: No results for input(s): "VITAMINB12", "FOLATE", "FERRITIN", "TIBC", "IRON", "RETICCTPCT" in the last 72 hours. Sepsis Labs: Recent Labs  Lab 08/20/23 0047 08/20/23 0238 08/20/23 0650 08/21/23 0346 08/24/23 0249  WBC 6.3  --   --  5.0 9.1  LATICACIDVEN  --  0.7 0.8  --   --     Microbiology Recent Results (from the past 240 hours)  Resp panel by RT-PCR (RSV, Flu A&B, Covid) Anterior Nasal Swab     Status: Abnormal   Collection Time: 08/19/23  7:34 PM   Specimen: Anterior Nasal Swab  Result Value Ref Range Status   SARS Coronavirus 2 by RT PCR NEGATIVE NEGATIVE Final    Comment: (NOTE) SARS-CoV-2 target nucleic acids are NOT DETECTED.  The SARS-CoV-2 RNA is generally detectable in upper respiratory specimens during the acute phase of infection. The lowest concentration of SARS-CoV-2 viral copies this assay can detect  is 138 copies/mL. A negative result does not preclude SARS-Cov-2 infection and should not be used as the sole basis for treatment or other patient management decisions. A negative result may occur with  improper specimen collection/handling, submission of specimen other than nasopharyngeal swab, presence of viral mutation(s) within the areas targeted by this assay, and inadequate number of viral copies(<138 copies/mL). A negative result must be combined with clinical observations, patient history, and epidemiological information. The expected result is Negative.  Fact Sheet for Patients:  BloggerCourse.com  Fact Sheet for Healthcare Providers:  SeriousBroker.it  This test is no t yet approved or cleared by the [place] FDA and  has been authorized for detection and/or diagnosis of SARS-CoV-2 by FDA under an Emergency Use Authorization (EUA). This EUA will remain  in effect (meaning this test can be used) for the duration of the COVID-19 declaration under Section 564(b)(1) of the Act, 21 U.S.C.section 360bbb-3(b)(1), unless the authorization is terminated  or revoked sooner.       Influenza A by PCR NEGATIVE NEGATIVE Final   Influenza B by PCR NEGATIVE NEGATIVE Final    Comment: (NOTE) The Xpert Xpress SARS-CoV-2/FLU/RSV plus assay is intended as an aid in the diagnosis of influenza from Nasopharyngeal swab specimens and should not be used as a sole basis for treatment. Nasal washings and aspirates are unacceptable for Xpert Xpress SARS-CoV-2/FLU/RSV testing.  Fact Sheet for Patients: BloggerCourse.com  Fact Sheet for Healthcare Providers: SeriousBroker.it  This test is not yet approved or cleared by the [place] FDA and has been authorized for detection and/or diagnosis of SARS-CoV-2 by FDA under an Emergency Use Authorization (EUA). This EUA will remain in effect  (meaning this test can be used) for the duration of the COVID-19 declaration under Section 564(b)(1) of the Act, 21 U.S.C. section 360bbb-3(b)(1), unless the authorization is terminated or revoked.     Resp Syncytial Virus by PCR POSITIVE (A) NEGATIVE Final    Comment: (NOTE) Fact Sheet for Patients: BloggerCourse.com  Fact Sheet for Healthcare Providers: SeriousBroker.it  This test is not yet approved or cleared by the [place] FDA and has been authorized for detection and/or diagnosis of SARS-CoV-2 by FDA under an Emergency Use Authorization (EUA). This EUA will remain  in effect (meaning this test can be used) for the duration of the COVID-19 declaration under Section 564(b)(1) of the Act, 21 U.S.C. section 360bbb-3(b)(1), unless the authorization is terminated or revoked.  Performed at [facility], [address] [place], [place] [address]   Culture, blood (routine x 2)     Status: None   Collection Time: 08/20/23  2:38 AM   Specimen: BLOOD  Result Value Ref Range Status   Specimen Description BLOOD RIGHT ARM  Final   Special Requests   Final    BOTTLES DRAWN AEROBIC AND ANAEROBIC Blood Culture results may not be optimal due to an inadequate volume of blood received in culture bottles   Culture   Final    NO GROWTH 5 DAYS Performed at [facility], [address], [place], [place] [address]    Report Status 08/25/2023 FINAL  Final  Urine Culture     Status: None   Collection Time: 08/20/23  3:33 AM   Specimen: In/Out Cath Urine  Result Value Ref Range Status   Specimen Description   Final    IN/OUT CATH URINE Performed at [facility], [address], [place], [place] [address]    Special Requests   Final    NONE Performed at [facility], [address], [place], [place] [address]    Culture   Final    NO GROWTH Performed at [facility], [address]  [place], [place] [address]    Report Status 08/21/2023 FINAL  Final  Culture, blood (routine x 2)     Status: None   Collection Time: 08/20/23  6:56 AM   Specimen: BLOOD LEFT HAND  Result Value Ref Range Status   Specimen Description BLOOD LEFT HAND  Final   Special Requests   Final    BOTTLES DRAWN AEROBIC AND ANAEROBIC Blood Culture results may not be optimal due to an inadequate volume of blood received in culture bottles   Culture   Final    NO GROWTH 5 DAYS Performed at [facility], [address], [place], [place] [address]    Report Status 08/25/2023 FINAL  Final    Procedures and diagnostic studies:  No results found.             LOS: 0 days   Marijo Quizon  [facility]   Pager on [email or web address]. If 7PM-7AM, please contact night-coverage at [email or web address]     08/26/2023, 3:50 PM

## 2023-08-26 NOTE — Plan of Care (Signed)

## 2023-08-26 NOTE — TOC Progression Note (Signed)
Transition of Care [facility]) - Progression Note    Patient Details  Name: Amy [name] MRN: [id number] Date of Birth: 1927/04/09  Transition of Care [facility]) CM/SW Contact  Liliana Cline, LCSW Phone Number: 08/26/2023, 11:54 AM  Clinical Narrative:    Berkley Harvey started in [facility].        Expected Discharge Plan and Services                                               Social Determinants of Health (SDOH) Interventions SDOH Screenings   Food Insecurity: Patient Unable To Answer (08/20/2023)  Housing: Patient Unable To Answer (08/20/2023)  Transportation Needs: Patient Unable To Answer (08/20/2023)  Utilities: Patient Unable To Answer (08/20/2023)  Financial Resource Strain: Low Risk  (07/24/2023)   Received from [facility]  Social Connections: Patient Unable To Answer (08/20/2023)  Tobacco Use: Low Risk  (08/19/2023)    Readmission Risk Interventions     No data to display

## 2023-08-26 NOTE — Plan of Care (Signed)

## 2023-08-27 DIAGNOSIS — R531 Weakness: Secondary | ICD-10-CM | POA: Diagnosis not present

## 2023-08-27 NOTE — Plan of Care (Signed)

## 2023-08-27 NOTE — Plan of Care (Signed)

## 2023-08-27 NOTE — Progress Notes (Signed)
Progress Note    Amy [name]  WUJ:[id number] DOB: 1927/03/05  DOA: 08/20/2023 PCP: Marguarite Arbour, MD      Brief Narrative:    Medical records reviewed and are as summarized below:  Amy [name] is a 89 y.o. 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 Patient has been noted to have worsening/progression in dementia symptoms. She has become less active. She continues to mobilize with a rolling walker but spends most of her days sitting in a chair with her legs elevated. Daughter reports patient having difficulty swallowing larger pills. She has developed urinary incontinence and now requires depends continuously. Daughter verbalizes concerns over inability to care for her mother as she becomes progressively weaker. Daughter also states she has a husband with cancer at home who she is primary caretaker of his well.    01/27: Admitted to hospitalist service for supportive care for RSV, AMS/weakness, family seeking placement, PT/OT eval, recommendations for SNF 01/28: SNF pending        Assessment/Plan:   Principal Problem:   Generalized weakness Active Problems:   Protein-calorie malnutrition, severe   Nutrition Problem: Severe Malnutrition Etiology: chronic illness (dementia)  Signs/Symptoms: severe muscle depletion, severe fat depletion   Body mass index is 23.78 kg/m.   Altered mental status/metabolic encephalopathy due to acute RSV infection, complicated by underlying dementia likely progressing. Encephalopathy improved Stable RSV infection.  She is tolerating room air. No more fever.   No evidence of UTI and recent urinalysis and culture.  No active disease on chest x-ray from 08/20/2023.   Debility, generalized weakness  PT/OT recommended discharge to SNF.  Awaiting placement.   Difficulty swallowing She was  previously evaluated by the speech therapist.  Regular diet and thin liquids recommended at that time. She was reevaluated by speech therapist on 08/24/2023.  Dysphagia 3 diet and nectar thick liquids were recommended.   Severe protein calorie malnutrition Continue nutritional supplements   Slight elevation in troponin Due to demand ischemia Trend is flat and downward No additional workup indicated   Hypertension Continue low-dose lisinopril.  Hold Lasix for now-according to daughter was on 20 mg every other day Cardiologist recently took her off of beta-blocker   Idiopathic cardiomyopathy EF 45 to 50% Medications as above Patient does have some mild lower extremity edema at home and per recommendation of cardiologist Lasix had been decreased to every other day and was primarily being used for edema, suspect the edema is likely related to low protein   Chronic atrial fibrillation Currently rate controlled although apparently had tachycardia on presentation No longer on beta-blocker for rate control Continue low-dose Eliquis     No acute issues.  She is medically stable for discharge but still awaiting placement to SNF.     Diet Order             DIET DYS 3 Room service appropriate? Yes with Assist; Fluid consistency: Nectar Thick  Diet effective now                            Consultants: None  Procedures: None    Medications:    apixaban  2.5 mg Oral BID   feeding supplement  237 mL Oral BID BM   lisinopril  2.5 mg Oral Daily   multivitamin with minerals  1 tablet Oral Daily  sodium chloride flush  3 mL Intravenous Q12H   vitamin B-12  100 mcg Oral Daily   Continuous Infusions:   Anti-infectives (From admission, onward)    Start     Dose/Rate Route Frequency Ordered Stop   08/21/23 1200  nitrofurantoin (MACRODANTIN) capsule 50 mg  Status:  Discontinued        50 mg Oral Daily 08/20/23 1416 08/22/23 1445              Family  Communication/Anticipated D/C date and plan/Code Status   DVT prophylaxis: apixaban (ELIQUIS) tablet 2.5 mg Start: 08/20/23 2200 apixaban (ELIQUIS) tablet 2.5 mg     Code Status: Do not attempt resuscitation (DNR) PRE-ARREST INTERVENTIONS DESIRED  Family Communication: None Disposition Plan: Plan to discharge to SNF   Status is: Observation The patient will require care spanning > 2 midnights and should be moved to inpatient because: Awaiting placement to SNF       Subjective:   No acute events overnight.  No fever, shortness of breath or chest pain.  She is confused and cannot provide an adequate history but denies any symptoms  Objective:    Vitals:   08/25/23 2359 08/26/23 0820 08/26/23 1401 08/27/23 0753  BP: 112/65 132/78 125/82 121/76  Pulse: 82 (!) 58 94 73  Resp: 18 16 19 16   Temp: 98.2 F (36.8 C) 98.2 F (36.8 C) 98.5 F (36.9 C) 98.6 F (37 C)  TempSrc: Oral     SpO2: 94% 93% 98% 99%  Weight:      Height:       No data found.   Intake/Output Summary (Last 24 hours) at 08/27/2023 1222 Last data filed at 08/27/2023 1100 Gross per 24 hour  Intake --  Output 1501 ml  Net -1501 ml   Filed Weights   08/19/23 1927  Weight: 59 kg    Exam:  GEN: NAD SKIN: Warm and dry EYES: No pallor or icterus ENT: MMM CV: RRR PULM: CTA B ABD: soft, ND, NT, +BS CNS: AAO x 1, non focal EXT: No edema or tenderness     Data Reviewed:   I have personally reviewed following labs and imaging studies:  Labs: Labs show the following:   Basic Metabolic Panel: Recent Labs  Lab 08/21/23 0346 08/24/23 0249  NA 137 137  K 3.5 3.7  CL 109 105  CO2 22 23  GLUCOSE 87 97  BUN 18 16  CREATININE 0.54 0.64  CALCIUM 8.5* 9.1   GFR Estimated Creatinine Clearance: 34 mL/min (by C-G formula based on SCr of 0.64 mg/dL). Liver Function Tests: No results for input(s): "AST", "ALT", "ALKPHOS", "BILITOT", "PROT", "ALBUMIN" in the last 168 hours.  No results for  input(s): "LIPASE", "AMYLASE" in the last 168 hours. No results for input(s): "AMMONIA" in the last 168 hours. Coagulation profile No results for input(s): "INR", "PROTIME" in the last 168 hours.  CBC: Recent Labs  Lab 08/21/23 0346 08/24/23 0249  WBC 5.0 9.1  NEUTROABS  --  7.3  HGB 10.9* 11.8*  HCT 32.5* 34.5*  MCV 97.9 94.8  PLT 232 272   Cardiac Enzymes: No results for input(s): "CKTOTAL", "CKMB", "CKMBINDEX", "TROPONINI" in the last 168 hours. BNP (last 3 results) No results for input(s): "PROBNP" in the last 8760 hours. CBG: No results for input(s): "GLUCAP" in the last 168 hours. D-Dimer: No results for input(s): "DDIMER" in the last 72 hours. Hgb A1c: No results for input(s): "HGBA1C" in the last 72 hours. Lipid Profile: No  results for input(s): "CHOL", "HDL", "LDLCALC", "TRIG", "CHOLHDL", "LDLDIRECT" in the last 72 hours. Thyroid function studies: No results for input(s): "TSH", "T4TOTAL", "T3FREE", "THYROIDAB" in the last 72 hours.  Invalid input(s): "FREET3" Anemia work up: No results for input(s): "VITAMINB12", "FOLATE", "FERRITIN", "TIBC", "IRON", "RETICCTPCT" in the last 72 hours. Sepsis Labs: Recent Labs  Lab 08/21/23 0346 08/24/23 0249  WBC 5.0 9.1    Microbiology Recent Results (from the past 240 hours)  Resp panel by RT-PCR (RSV, Flu A&B, Covid) Anterior Nasal Swab     Status: Abnormal   Collection Time: 08/19/23  7:34 PM   Specimen: Anterior Nasal Swab  Result Value Ref Range Status   SARS Coronavirus 2 by RT PCR NEGATIVE NEGATIVE Final    Comment: (NOTE) SARS-CoV-2 target nucleic acids are NOT DETECTED.  The SARS-CoV-2 RNA is generally detectable in upper respiratory specimens during the acute phase of infection. The lowest concentration of SARS-CoV-2 viral copies this assay can detect is 138 copies/mL. A negative result does not preclude SARS-Cov-2 infection and should not be used as the sole basis for treatment or other patient  management decisions. A negative result may occur with  improper specimen collection/handling, submission of specimen other than nasopharyngeal swab, presence of viral mutation(s) within the areas targeted by this assay, and inadequate number of viral copies(<138 copies/mL). A negative result must be combined with clinical observations, patient history, and epidemiological information. The expected result is Negative.  Fact Sheet for Patients:  BloggerCourse.com  Fact Sheet for Healthcare Providers:  SeriousBroker.it  This test is no t yet approved or cleared by the [place] FDA and  has been authorized for detection and/or diagnosis of SARS-CoV-2 by FDA under an Emergency Use Authorization (EUA). This EUA will remain  in effect (meaning this test can be used) for the duration of the COVID-19 declaration under Section 564(b)(1) of the Act, 21 U.S.C.section 360bbb-3(b)(1), unless the authorization is terminated  or revoked sooner.       Influenza A by PCR NEGATIVE NEGATIVE Final   Influenza B by PCR NEGATIVE NEGATIVE Final    Comment: (NOTE) The Xpert Xpress SARS-CoV-2/FLU/RSV plus assay is intended as an aid in the diagnosis of influenza from Nasopharyngeal swab specimens and should not be used as a sole basis for treatment. Nasal washings and aspirates are unacceptable for Xpert Xpress SARS-CoV-2/FLU/RSV testing.  Fact Sheet for Patients: BloggerCourse.com  Fact Sheet for Healthcare Providers: SeriousBroker.it  This test is not yet approved or cleared by the [place] FDA and has been authorized for detection and/or diagnosis of SARS-CoV-2 by FDA under an Emergency Use Authorization (EUA). This EUA will remain in effect (meaning this test can be used) for the duration of the COVID-19 declaration under Section 564(b)(1) of the Act, 21 U.S.C. section 360bbb-3(b)(1),  unless the authorization is terminated or revoked.     Resp Syncytial Virus by PCR POSITIVE (A) NEGATIVE Final    Comment: (NOTE) Fact Sheet for Patients: BloggerCourse.com  Fact Sheet for Healthcare Providers: SeriousBroker.it  This test is not yet approved or cleared by the [place] FDA and has been authorized for detection and/or diagnosis of SARS-CoV-2 by FDA under an Emergency Use Authorization (EUA). This EUA will remain in effect (meaning this test can be used) for the duration of the COVID-19 declaration under Section 564(b)(1) of the Act, 21 U.S.C. section 360bbb-3(b)(1), unless the authorization is terminated or revoked.  Performed at [facility], [address], [place], [place] [address]   Culture,  blood (routine x 2)     Status: None   Collection Time: 08/20/23  2:38 AM   Specimen: BLOOD  Result Value Ref Range Status   Specimen Description BLOOD RIGHT ARM  Final   Special Requests   Final    BOTTLES DRAWN AEROBIC AND ANAEROBIC Blood Culture results may not be optimal due to an inadequate volume of blood received in culture bottles   Culture   Final    NO GROWTH 5 DAYS Performed at [facility], [address], [place], [place] [address]    Report Status 08/25/2023 FINAL  Final  Urine Culture     Status: None   Collection Time: 08/20/23  3:33 AM   Specimen: In/Out Cath Urine  Result Value Ref Range Status   Specimen Description   Final    IN/OUT CATH URINE Performed at [facility], [address] [place], [place] [address]    Special Requests   Final    NONE Performed at [facility], [address] [place], [place] [address]    Culture   Final    NO GROWTH Performed at [name], [address], [place], [place] [address]    Report Status 08/21/2023 FINAL  Final  Culture, blood (routine x 2)     Status: None   Collection Time: 08/20/23  6:56 AM    Specimen: BLOOD LEFT HAND  Result Value Ref Range Status   Specimen Description BLOOD LEFT HAND  Final   Special Requests   Final    BOTTLES DRAWN AEROBIC AND ANAEROBIC Blood Culture results may not be optimal due to an inadequate volume of blood received in culture bottles   Culture   Final    NO GROWTH 5 DAYS Performed at [facility], [address] [place], [place] [address]    Report Status 08/25/2023 FINAL  Final    Procedures and diagnostic studies:  No results found.             LOS: 0 days   Arlington Sigmund  [facility]   Pager on [email or web address]. If 7PM-7AM, please contact night-coverage at [email or web address]     08/27/2023, 12:22 PM

## 2023-08-28 DIAGNOSIS — R531 Weakness: Secondary | ICD-10-CM | POA: Diagnosis not present

## 2023-08-28 MED ORDER — NEPRO/CARBSTEADY PO LIQD
237.0000 mL | Freq: Two times a day (BID) | ORAL | Status: DC
  Administered 2023-08-28 – 2023-08-29 (×2): 237 mL via ORAL

## 2023-08-28 NOTE — Progress Notes (Signed)
 Progress Note    LASEAN RAHMING  FMW:[id number] DOB: 12-Apr-1927  DOA: 08/20/2023 PCP: Auston Reyes BIRCH, MD      Brief Narrative:    Medical records reviewed and are as summarized below:  JAIME DOME is a 89 y.o. female with medical history significant of atrial fibrillation on Eliquis , dyslipidemia, hypertension, peripheral vascular disease, idiopathic cardiomyopathy with an EF of 45 to 50%. Patient has been noted to have worsening/progression in dementia symptoms. She has become less active. She continues to mobilize with a rolling walker but spends most of her days sitting in a chair with her legs elevated. Daughter reports patient having difficulty swallowing larger pills. She has developed urinary incontinence and now requires depends continuously. Daughter verbalizes concerns over inability to care for her mother as she becomes progressively weaker. Daughter also states she has a husband with cancer at home who she is primary caretaker of his well.    01/27: Admitted to hospitalist service for supportive care for RSV, AMS/weakness, family seeking placement, PT/OT eval, recommendations for SNF 01/28: SNF pending        Assessment/Plan:   Principal Problem:   Generalized weakness Active Problems:   Protein-calorie malnutrition, severe   Nutrition Problem: Severe Malnutrition Etiology: chronic illness (dementia)  Signs/Symptoms: severe muscle depletion, severe fat depletion   Body mass index is 23.78 kg/m.   Altered mental status/metabolic encephalopathy due to acute RSV infection, complicated by underlying dementia likely progressing. Encephalopathy improved Improved. No more fever.   No evidence of UTI and recent urinalysis and culture.  No active disease on chest x-ray from 08/20/2023.   Debility, generalized weakness  PT/OT recommended discharge to SNF.  Awaiting placement.   Difficulty swallowing She was previously evaluated by the speech therapist.   Regular diet and thin liquids recommended at that time. She was reevaluated by speech therapist on 08/24/2023.  Dysphagia 3 diet and nectar thick liquids were recommended.   Severe protein calorie malnutrition Continue nutritional supplements   Slight elevation in troponin Due to demand ischemia Trend is flat and downward No additional workup indicated   Hypertension Continue low-dose lisinopril .  Hold Lasix for now-according to daughter was on 20 mg every other day Cardiologist recently took her off of beta-blocker   Idiopathic cardiomyopathy EF 45 to 50% Medications as above Patient does have some mild lower extremity edema at home and per recommendation of cardiologist Lasix had been decreased to every other day and was primarily being used for edema, suspect the edema is likely related to low protein   Chronic atrial fibrillation Currently rate controlled although apparently had tachycardia on presentation No longer on beta-blocker for rate control Continue low-dose Eliquis      She is stable for discharge.  Awaiting placement.     Diet Order             DIET DYS 3 Room service appropriate? Yes with Assist; Fluid consistency: Nectar Thick  Diet effective now                            Consultants: None  Procedures: None    Medications:    apixaban   2.5 mg Oral BID   feeding supplement (NEPRO CARB STEADY)  237 mL Oral BID BM   lisinopril   2.5 mg Oral Daily   multivitamin with minerals  1 tablet Oral Daily   sodium chloride  flush  3 mL Intravenous Q12H   vitamin B-12  100 mcg Oral Daily   Continuous Infusions:   Anti-infectives (From admission, onward)    Start     Dose/Rate Route Frequency Ordered Stop   08/21/23 1200  nitrofurantoin  (MACRODANTIN ) capsule 50 mg  Status:  Discontinued        50 mg Oral Daily 08/20/23 1416 08/22/23 1445              Family Communication/Anticipated D/C date and plan/Code Status   DVT prophylaxis:  apixaban  (ELIQUIS ) tablet 2.5 mg Start: 08/20/23 2200 apixaban  (ELIQUIS ) tablet 2.5 mg     Code Status: Do not attempt resuscitation (DNR) PRE-ARREST INTERVENTIONS DESIRED  Family Communication: None Disposition Plan: Plan to discharge to SNF   Status is: Observation The patient will require care spanning > 2 midnights and should be moved to inpatient because: Awaiting placement to SNF       Subjective:   Interval events noted.  She has no complaints.  Objective:    Vitals:   08/27/23 1603 08/27/23 1621 08/27/23 2211 08/28/23 0741  BP: 107/62  (!) 104/50 (!) 107/50  Pulse: 72 66 60 85  Resp: 17  19 16   Temp: 97.8 F (36.6 C)  98.1 F (36.7 C) 97.7 F (36.5 C)  TempSrc:   Oral   SpO2: (!) 84% 99% 98% 95%  Weight:      Height:       No data found.   Intake/Output Summary (Last 24 hours) at 08/28/2023 1407 Last data filed at 08/28/2023 1100 Gross per 24 hour  Intake --  Output 400 ml  Net -400 ml   Filed Weights   08/19/23 1927  Weight: 59 kg    Exam:  GEN: NAD SKIN: Warm and dry EYES: No pallor or icterus ENT: MMM CV: RRR PULM: CTA B ABD: soft, ND, NT, +BS CNS: AAO x 3, non focal EXT: No edema or tenderness      Data Reviewed:   I have personally reviewed following labs and imaging studies:  Labs: Labs show the following:   Basic Metabolic Panel: Recent Labs  Lab 08/24/23 0249  NA 137  K 3.7  CL 105  CO2 23  GLUCOSE 97  BUN 16  CREATININE 0.64  CALCIUM 9.1   GFR Estimated Creatinine Clearance: 34 mL/min (by C-G formula based on SCr of 0.64 mg/dL). Liver Function Tests: No results for input(s): AST, ALT, ALKPHOS, BILITOT, PROT, ALBUMIN in the last 168 hours.  No results for input(s): LIPASE, AMYLASE in the last 168 hours. No results for input(s): AMMONIA in the last 168 hours. Coagulation profile No results for input(s): INR, PROTIME in the last 168 hours.  CBC: Recent Labs  Lab 08/24/23 0249  WBC  9.1  NEUTROABS 7.3  HGB 11.8*  HCT 34.5*  MCV 94.8  PLT 272   Cardiac Enzymes: No results for input(s): CKTOTAL, CKMB, CKMBINDEX, TROPONINI in the last 168 hours. BNP (last 3 results) No results for input(s): PROBNP in the last 8760 hours. CBG: No results for input(s): GLUCAP in the last 168 hours. D-Dimer: No results for input(s): DDIMER in the last 72 hours. Hgb A1c: No results for input(s): HGBA1C in the last 72 hours. Lipid Profile: No results for input(s): CHOL, HDL, LDLCALC, TRIG, CHOLHDL, LDLDIRECT in the last 72 hours. Thyroid function studies: No results for input(s): TSH, T4TOTAL, T3FREE, THYROIDAB in the last 72 hours.  Invalid input(s): FREET3 Anemia work up: No results for input(s): VITAMINB12, FOLATE, FERRITIN, TIBC, IRON, RETICCTPCT in the last 72 hours.  Sepsis Labs: Recent Labs  Lab 08/24/23 0249  WBC 9.1    Microbiology Recent Results (from the past 240 hours)  Resp panel by RT-PCR (RSV, Flu A&B, Covid) Anterior Nasal Swab     Status: Abnormal   Collection Time: 08/19/23  7:34 PM   Specimen: Anterior Nasal Swab  Result Value Ref Range Status   SARS Coronavirus 2 by RT PCR NEGATIVE NEGATIVE Final    Comment: (NOTE) SARS-CoV-2 target nucleic acids are NOT DETECTED.  The SARS-CoV-2 RNA is generally detectable in upper respiratory specimens during the acute phase of infection. The lowest concentration of SARS-CoV-2 viral copies this assay can detect is 138 copies/mL. A negative result does not preclude SARS-Cov-2 infection and should not be used as the sole basis for treatment or other patient management decisions. A negative result may occur with  improper specimen collection/handling, submission of specimen other than nasopharyngeal swab, presence of viral mutation(s) within the areas targeted by this assay, and inadequate number of viral copies(<138 copies/mL). A negative result must be combined  with clinical observations, patient history, and epidemiological information. The expected result is Negative.  Fact Sheet for Patients:  bloggercourse.com  Fact Sheet for Healthcare Providers:  seriousbroker.it  This test is no t yet approved or cleared by the [place]  FDA and  has been authorized for detection and/or diagnosis of SARS-CoV-2 by FDA under an Emergency Use Authorization (EUA). This EUA will remain  in effect (meaning this test can be used) for the duration of the COVID-19 declaration under Section 564(b)(1) of the Act, 21 U.S.C.section 360bbb-3(b)(1), unless the authorization is terminated  or revoked sooner.       Influenza A by PCR NEGATIVE NEGATIVE Final   Influenza B by PCR NEGATIVE NEGATIVE Final    Comment: (NOTE) The Xpert Xpress SARS-CoV-2/FLU/RSV plus assay is intended as an aid in the diagnosis of influenza from Nasopharyngeal swab specimens and should not be used as a sole basis for treatment. Nasal washings and aspirates are unacceptable for Xpert Xpress SARS-CoV-2/FLU/RSV testing.  Fact Sheet for Patients: bloggercourse.com  Fact Sheet for Healthcare Providers: seriousbroker.it  This test is not yet approved or cleared by the [place]  FDA and has been authorized for detection and/or diagnosis of SARS-CoV-2 by FDA under an Emergency Use Authorization (EUA). This EUA will remain in effect (meaning this test can be used) for the duration of the COVID-19 declaration under Section 564(b)(1) of the Act, 21 U.S.C. section 360bbb-3(b)(1), unless the authorization is terminated or revoked.     Resp Syncytial Virus by PCR POSITIVE (A) NEGATIVE Final    Comment: (NOTE) Fact Sheet for Patients: bloggercourse.com  Fact Sheet for Healthcare Providers: seriousbroker.it  This test is not yet  approved or cleared by the [place]  FDA and has been authorized for detection and/or diagnosis of SARS-CoV-2 by FDA under an Emergency Use Authorization (EUA). This EUA will remain in effect (meaning this test can be used) for the duration of the COVID-19 declaration under Section 564(b)(1) of the Act, 21 U.S.C. section 360bbb-3(b)(1), unless the authorization is terminated or revoked.  Performed at [facility], [address] [place], [place] [address]   Culture, blood (routine x 2)     Status: None   Collection Time: 08/20/23  2:38 AM   Specimen: BLOOD  Result Value Ref Range Status   Specimen Description BLOOD RIGHT ARM  Final   Special Requests   Final    BOTTLES DRAWN AEROBIC AND ANAEROBIC Blood Culture results  may not be optimal due to an inadequate volume of blood received in culture bottles   Culture   Final    NO GROWTH 5 DAYS Performed at [facility], [address] [place], [place] [address]    Report Status 08/25/2023 FINAL  Final  Urine Culture     Status: None   Collection Time: 08/20/23  3:33 AM   Specimen: In/Out Cath Urine  Result Value Ref Range Status   Specimen Description   Final    IN/OUT CATH URINE Performed at [facility], [address], [place], [place] [address]    Special Requests   Final    NONE Performed at [facility], [address], [place], [place] [address]    Culture   Final    NO GROWTH Performed at [facility], [address], [place], [place] [address]    Report Status 08/21/2023 FINAL  Final  Culture, blood (routine x 2)     Status: None   Collection Time: 08/20/23  6:56 AM   Specimen: BLOOD LEFT HAND  Result Value Ref Range Status   Specimen Description BLOOD LEFT HAND  Final   Special Requests   Final    BOTTLES DRAWN AEROBIC AND ANAEROBIC Blood Culture results may not be optimal due to an inadequate volume of blood received in culture bottles   Culture   Final    NO  GROWTH 5 DAYS Performed at [facility], [address], [place], [place] [address]    Report Status 08/25/2023 FINAL  Final    Procedures and diagnostic studies:  No results found.             LOS: 0 days   Morghan Kester  [facility]   Pager on [email or web address]. If 7PM-7AM, please contact night-coverage at [email or web address]     08/28/2023, 2:07 PM

## 2023-08-28 NOTE — Plan of Care (Signed)

## 2023-08-28 NOTE — Progress Notes (Addendum)
 Nutrition Follow-up  DOCUMENTATION CODES:   Severe malnutrition in context of chronic illness  INTERVENTION:   -D/c Ensure Enlive -Continue MVI with minerals daily -Continue Magic cup TID with meals, each supplement provides 290 kcal and 9 grams of protein  -Nepro Shake po BID, each supplement provides 425 kcal and 19 grams protein  -Continue feeding assistance with meals  NUTRITION DIAGNOSIS:   Severe Malnutrition related to chronic illness (dementia) as evidenced by severe muscle depletion, severe fat depletion.  Ongoing  GOAL:   Patient will meet greater than or equal to 90% of their needs  Progressing   MONITOR:   PO intake, Weight trends, Supplement acceptance  REASON FOR ASSESSMENT:   Consult Assessment of nutrition requirement/status  ASSESSMENT:   Pt presents with cough, altered mental status and generalized weakness with recent fall last week. PMH of dementia, afib on Eliquis , dyslipidemia, HTN, peripheral vascular disease, idiopathic cardiomyopathy.  1/31- s/p BSE- downgraded diet to dysphagia 3 with nectar thick liquids  Reviewed I/O's: -501 ml x 24 hours and -1.4 L since admission  UOP: 500 ml x 24 hours  Pt sleeping soundly at time of visit. She did not respond to voice. No family at bedside.   Pt has been consuming 50-100% of meals. Observed breakfast tray- pt consumed a few bites of eggs and sausage, 100% of milk, juice, and grits.   No new wt since last visit.   Medications reviewed and include vitamin B-12.   Per TOC notes, pt awaiting insurance authorization for discharge to SNF.   Labs reviewed.    Diet Order:   Diet Order             DIET DYS 3 Room service appropriate? Yes with Assist; Fluid consistency: Nectar Thick  Diet effective now                   EDUCATION NEEDS:   Education needs have been addressed  Skin:  Skin Assessment: Skin Integrity Issues: Skin Integrity Issues:: Stage I, Other (Comment) Stage I: rt  posterior elbow, lt posterior elbow, medial vertebral column Other: upper head laceration  Last BM:  08/27/23  Height:   Ht Readings from Last 1 Encounters:  08/19/23 5' 2 (1.575 m)    Weight:   Wt Readings from Last 1 Encounters:  08/19/23 59 kg    Ideal Body Weight:  50 kg  BMI:  Body mass index is 23.78 kg/m.  Estimated Nutritional Needs:   Kcal:  1500-1700  Protein:  80-95 g  Fluid:  1.5-1.7 L    Margery ORN, RD, LDN, CDCES Registered Dietitian III Certified Diabetes Care and Education Specialist If unable to reach this RD, please use RD Inpatient group chat on secure chat between hours of 8am-4 pm daily

## 2023-08-29 DIAGNOSIS — R531 Weakness: Secondary | ICD-10-CM | POA: Diagnosis not present

## 2023-08-29 MED ORDER — NEPRO/CARBSTEADY PO LIQD: 237.0000 mL | Freq: Two times a day (BID) | ORAL | Status: AC

## 2023-08-29 NOTE — Progress Notes (Signed)
 Occupational Therapy Treatment Patient Details Name: Amy [name] MRN: [id number] DOB: Jun 11, 1927 Today's Date: 08/29/2023   History of present illness Pt is a 89 y.o. female brought to the ED from home by her daughter with a chief complaint of cough, altered mental status and generalized weakness with recent fall history.  MD assessment includes: altered mental status/underlying dementia/acute RSV infection, protein calorie malnutrition, difficulty swallowing, and slight elevation of troponin due to demand ischemia. 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   OT comments  Chart reviewed, pt greeted in room with PT present on bsc, hand off provided. Tx session targeted improving functional activity tolerance in the setting of ADL tasks. Improvements noted for out of bed mobility, performing step pivot transfer from bsc to bedside chair with MOD A with RW, MIN A +2 for initial STS. MAX A required for peri care after continent BM on toilet, SET UP for grooming, supervision-SET UP for feeding tasks. Frequent-step by step multi model cues required throughout. Pt is making progress towards goals, discharge remains appropriate. Nurse in room at completion of session. OT will continue to follow.       If plan is discharge home, recommend the following:  A lot of help with bathing/dressing/bathroom;A little help with walking and/or transfers;Direct supervision/assist for medications management;Supervision due to cognitive status;Direct supervision/assist for financial management;Assist for transportation;Assistance with cooking/housework;Help with stairs or ramp for entrance   Equipment Recommendations  BSC/3in1;Hospital bed    Recommendations for Other Services      Precautions / Restrictions Precautions Precautions: Fall Restrictions Weight Bearing Restrictions Per Provider Order: No       Mobility Bed Mobility                General bed mobility comments: NT on bsc upon OT entering room, in bedside chair post session    Transfers Overall transfer level: Needs assistance Equipment used: Rolling walker (2 wheels) Transfers: Sit to/from Stand Sit to Stand: Min assist, +2 physical assistance                 Balance Overall balance assessment: Needs assistance Sitting-balance support: No upper extremity supported, Feet supported Sitting balance-Leahy Scale: Fair     Standing balance support: Bilateral upper extremity supported, Reliant on assistive device for balance, During functional activity Standing balance-Leahy Scale: Poor                             ADL either performed or assessed with clinical judgement   ADL Overall ADL's : Needs assistance/impaired Eating/Feeding: Set up;Sitting;Supervision/ safety   Grooming: Wash/dry face;Sitting;Supervision/safety Grooming Details (indicate cue type and reason): intermittent vcs for sustained attention to task     Lower Body Bathing: Maximal assistance;Sit to/from stand       Lower Body Dressing: Maximal assistance;Sitting/lateral leans Lower Body Dressing Details (indicate cue type and reason): underwear Toilet Transfer: Moderate assistance;Rolling walker (2 wheels) Toilet Transfer Details (indicate cue type and reason): step by step multi modal cues Toileting- Clothing Manipulation and Hygiene: Sit to/from stand;Maximal assistance Toileting - Clothing Manipulation Details (indicate cue type and reason): after continent BM on toilet            Extremity/Trunk Assessment              Vision       Perception     Praxis      Cognition Arousal:  Alert Behavior During Therapy: WFL for tasks assessed/performed, Lability Overall Cognitive Status: Impaired/Different from baseline Area of Impairment: Orientation, Attention, Following commands, Safety/judgement, Awareness, Problem solving                  Orientation Level: Disoriented to, Situation Current Attention Level: Focused   Following Commands: Follows one step commands with increased time Safety/Judgement: Decreased awareness of safety, Decreased awareness of deficits Awareness: Intellectual Problem Solving: Slow processing, Decreased initiation, Requires verbal cues, Requires tactile cues          Exercises Other Exercises Other Exercises: edu re: role of OT, role of rehab, importanec of progressing mobility    Shoulder Instructions       General Comments vss throughout    Pertinent Vitals/ Pain       Pain Assessment Pain Assessment: No/denies pain  Home Living                                          Prior Functioning/Environment              Frequency  Min 1X/week        Progress Toward Goals  OT Goals(current goals can now be found in the care plan section)  Progress towards OT goals: Progressing toward goals  Acute Rehab OT Goals Time For Goal Achievement: 09/03/23  Plan      Co-evaluation                 AM-PAC OT 6 Clicks Daily Activity     Outcome Measure   Help from another person eating meals?: None Help from another person taking care of personal grooming?: A Little Help from another person toileting, which includes using toliet, bedpan, or urinal?: A Lot Help from another person bathing (including washing, rinsing, drying)?: A Lot Help from another person to put on and taking off regular upper body clothing?: A Little Help from another person to put on and taking off regular lower body clothing?: A Lot 6 Click Score: 16    End of Session Equipment Utilized During Treatment: Rolling walker (2 wheels)  OT Visit Diagnosis: Other abnormalities of gait and mobility (R26.89);Unsteadiness on feet (R26.81);Muscle weakness (generalized) (M62.81);History of falling (Z91.81)   Activity Tolerance Patient tolerated treatment well   Patient Left in chair;with  call bell/phone within reach;with chair alarm set   Nurse Communication Mobility status        Time: 9073-9050 OT Time Calculation (min): 23 min  Charges: OT General Charges $OT Visit: 1 Visit OT Treatments $Self Care/Home Management : 8-22 mins  Therisa Sheffield, OTD OTR/L  08/29/23, 10:01 AM

## 2023-08-29 NOTE — Progress Notes (Signed)
 Called report to [facility]. Questions asked and answered. EMS has d/c packet. DNR with EMS. Nsg Discharge Note  Admit Date:  08/20/2023 Discharge date: 08/29/2023   Mardeen LILLETTE Novak to be D/C'd Skilled nursing facility per MD order.  AVS completed.  Copy for chart, and copy for patient signed, and dated. Patient/caregiver able to verbalize understanding.  Discharge Medication: Allergies as of 08/29/2023       Reactions   Sulfa Antibiotics Rash        Medication List     STOP taking these medications    aspirin EC 81 MG tablet   Biotin 1000 MCG tablet   FISH OIL PO   magnesium oxide 400 MG tablet Commonly known as: MAG-OX   meloxicam 7.5 MG tablet Commonly known as: MOBIC   metoprolol succinate 25 MG 24 hr tablet Commonly known as: TOPROL-XL   nitrofurantoin  50 MG capsule Commonly known as: MACRODANTIN    polyethylene glycol 17 g packet Commonly known as: MIRALAX / GLYCOLAX   Vitamin D3 50 MCG (2000 UT) capsule       TAKE these medications    acetaminophen  500 MG tablet Commonly known as: TYLENOL  Take 500 mg by mouth every 6 (six) hours as needed for mild pain (pain score 1-3) or moderate pain (pain score 4-6).   cyanocobalamin  1000 MCG tablet Commonly known as: VITAMIN B12 Take by mouth.   Eliquis  2.5 MG Tabs tablet Generic drug: apixaban  Take 2.5 mg by mouth 2 (two) times daily.   feeding supplement (NEPRO CARB STEADY) Liqd Take 237 mLs by mouth 2 (two) times daily between meals.   fluticasone 50 MCG/ACT nasal spray Commonly known as: FLONASE Place into the nose.   furosemide 20 MG tablet Commonly known as: LASIX Take 20 mg by mouth every other day.   lisinopril  2.5 MG tablet Commonly known as: ZESTRIL  Take 2.5 mg by mouth 2 (two) times daily.   Multi-Vitamins Tabs Take by mouth.               Discharge Care Instructions  (From admission, onward)           Start     Ordered   08/29/23 0000  Discharge wound care:        Comments: Pressure Injury 08/23/23 Elbow Left;Posterior Stage 1 -  Intact skin with non-blanchable redness of a localized area usually over a bony prominence. 6 days    Pressure Injury 08/23/23 Elbow Posterior;Right Stage 1 -  Intact skin with non-blanchable redness of a localized area usually over a bony prominence. 6 days   Pressure Injury 08/23/23 Vertebral column Medial Stage 1 -  Intact skin with non-blanchable redness of a localized area usually over a bony prominence.   08/29/23 1031            Discharge Assessment: Vitals:   08/28/23 1500 08/29/23 0931  BP: 120/61 (!) 118/99  Pulse: 89 81  Resp: 16 16  Temp: 98.3 F (36.8 C) 97.7 F (36.5 C)  SpO2: 98% 98%   Skin clean, dry and intact without evidence of skin break down, no evidence of skin tears noted. IV catheter discontinued intact. Site without signs and symptoms of complications - no redness or edema noted at insertion site, patient denies c/o pain - only slight tenderness at site.  Dressing with slight pressure applied.  D/c Instructions-Education: Discharge instructions given to EMS with verbalized understanding. D/c education completed with patient/family including follow up instructions, medication list, d/c activities limitations if  indicated, with other d/c instructions as indicated by MD - patient able to verbalize understanding, all questions fully answered. Patient instructed to return to ED, call 911, or call MD for any changes in condition.  Patient escorted via WC, and D/C home via private auto.  Almarie DELENA Garret, RN 08/29/2023 1:44 PM

## 2023-08-29 NOTE — Discharge Summary (Signed)
 Physician Discharge Summary  Amy [name] FMW:[id number] DOB: 07-Mar-1927 DOA: 08/20/2023  PCP: Auston Reyes BIRCH, MD  Admit date: 08/20/2023 Discharge date: 08/29/2023  Admitted From: Home Disposition:  SNF  Recommendations for Outpatient Follow-up:  Follow up with PCP in 1-2 weeks   Home Health:NNo  Equipment/Devices:None   Discharge Condition:Stable  CODE STATUS:DNR  Diet recommendation: Dysphagia 3  Brief/Interim Summary:  Amy [name] is a 89 y.o. female with medical history significant of atrial fibrillation on Eliquis , dyslipidemia, hypertension, peripheral vascular disease, idiopathic cardiomyopathy with an EF of 45 to 50%. Patient has been noted to have worsening/progression in dementia symptoms. She has become less active. She continues to mobilize with a rolling walker but spends most of her days sitting in a chair with her legs elevated. Daughter reports patient having difficulty swallowing larger pills. She has developed urinary incontinence and now requires depends continuously. Daughter verbalizes concerns over inability to care for her mother as she becomes progressively weaker. Daughter also states she has a husband with cancer at home who she is primary caretaker of his well.    01/27: Admitted to hospitalist service for supportive care for RSV, AMS/weakness, family seeking placement, PT/OT eval, recommendations for SNF 01/28: SNF pending 2/5: Auth obtained.  Stable for DC    Discharge Diagnoses:  Principal Problem:   Generalized weakness Active Problems:   Protein-calorie malnutrition, severe  Altered mental status/metabolic encephalopathy due to acute RSV infection, complicated by underlying dementia likely progressing. Encephalopathy improved Improved. No more fever.   No evidence of UTI and recent urinalysis and culture.  No active disease on chest x-ray from 08/20/2023.   Debility, generalized weakness  PT/OT recommended discharge to SNF.  Auth obtained    Difficulty swallowing She was previously evaluated by the speech therapist.  Regular diet and thin liquids recommended at that time. She was reevaluated by speech therapist on 08/24/2023.  Dysphagia 3 diet and nectar thick liquids were recommended.   Severe protein calorie malnutrition Continue nutritional supplements   Slight elevation in troponin Due to demand ischemia Trend is flat and downward No additional workup indicated   Hypertension Continue low-dose lisinopril .   Resume every other day lasix 20mg  Cardiologist recently took her off of beta-blocker, stopped on dc   Idiopathic cardiomyopathy EF 45 to 50% Medications as above Patient does have some mild lower extremity edema at home and per recommendation of cardiologist Lasix had been decreased to every other day and was primarily being used for edema, suspect the edema is likely related to low protein Low dose Lasix resumed on dc   Chronic atrial fibrillation Currently rate controlled although apparently had tachycardia on presentation No longer on beta-blocker for rate control Continue low-dose Eliquis   Discharge Instructions  Discharge Instructions     Diet - low sodium heart healthy   Complete by: As directed    Discharge wound care:   Complete by: As directed    Pressure Injury 08/23/23 Elbow Left;Posterior Stage 1 -  Intact skin with non-blanchable redness of a localized area usually over a bony prominence. 6 days    Pressure Injury 08/23/23 Elbow Posterior;Right Stage 1 -  Intact skin with non-blanchable redness of a localized area usually over a bony prominence. 6 days   Pressure Injury 08/23/23 Vertebral column Medial Stage 1 -  Intact skin with non-blanchable redness of a localized area usually over a bony prominence.   Increase activity slowly   Complete by: As directed  Allergies as of 08/29/2023       Reactions   Sulfa Antibiotics Rash        Medication List     STOP taking these  medications    aspirin EC 81 MG tablet   Biotin 1000 MCG tablet   FISH OIL PO   magnesium oxide 400 MG tablet Commonly known as: MAG-OX   meloxicam 7.5 MG tablet Commonly known as: MOBIC   metoprolol succinate 25 MG 24 hr tablet Commonly known as: TOPROL-XL   nitrofurantoin  50 MG capsule Commonly known as: MACRODANTIN    polyethylene glycol 17 g packet Commonly known as: MIRALAX / GLYCOLAX   Vitamin D3 50 MCG (2000 UT) capsule       TAKE these medications    acetaminophen  500 MG tablet Commonly known as: TYLENOL  Take 500 mg by mouth every 6 (six) hours as needed for mild pain (pain score 1-3) or moderate pain (pain score 4-6).   cyanocobalamin  1000 MCG tablet Commonly known as: VITAMIN B12 Take by mouth.   Eliquis  2.5 MG Tabs tablet Generic drug: apixaban  Take 2.5 mg by mouth 2 (two) times daily.   feeding supplement (NEPRO CARB STEADY) Liqd Take 237 mLs by mouth 2 (two) times daily between meals.   fluticasone 50 MCG/ACT nasal spray Commonly known as: FLONASE Place into the nose.   furosemide 20 MG tablet Commonly known as: LASIX Take 20 mg by mouth every other day.   lisinopril  2.5 MG tablet Commonly known as: ZESTRIL  Take 2.5 mg by mouth 2 (two) times daily.   Multi-Vitamins Tabs Take by mouth.               Discharge Care Instructions  (From admission, onward)           Start     Ordered   08/29/23 0000  Discharge wound care:       Comments: Pressure Injury 08/23/23 Elbow Left;Posterior Stage 1 -  Intact skin with non-blanchable redness of a localized area usually over a bony prominence. 6 days    Pressure Injury 08/23/23 Elbow Posterior;Right Stage 1 -  Intact skin with non-blanchable redness of a localized area usually over a bony prominence. 6 days   Pressure Injury 08/23/23 Vertebral column Medial Stage 1 -  Intact skin with non-blanchable redness of a localized area usually over a bony prominence.   08/29/23 1031             Allergies  Allergen Reactions   Sulfa Antibiotics Rash    Consultations: None   Procedures/Studies: DG Chest Port 1 View Result Date: 08/20/2023 CLINICAL DATA:  Shortness of breath EXAM: PORTABLE CHEST 1 VIEW COMPARISON:  07/15/2012 FINDINGS: Cardiac shadow is enlarged. Aortic calcifications are noted. The lungs are well aerated bilaterally. No focal infiltrate or effusion is seen. No bony abnormality is noted. IMPRESSION: No active disease. Electronically Signed   By: Oneil Devonshire M.D.   On: 08/20/2023 01:55   CT Head Wo Contrast Result Date: 08/19/2023 CLINICAL DATA:  Head trauma, focal neuro findings (Age 72-64y) EXAM: CT HEAD WITHOUT CONTRAST TECHNIQUE: Contiguous axial images were obtained from the base of the skull through the vertex without intravenous contrast. RADIATION DOSE REDUCTION: This exam was performed according to the departmental dose-optimization program which includes automated exposure control, adjustment of the mA and/or kV according to patient size and/or use of iterative reconstruction technique. COMPARISON:  CT head 08/13/2023. FINDINGS: Brain: No evidence of acute infarction, hemorrhage, hydrocephalus, extra-axial collection or mass lesion/mass effect.  Patchy white matter hypodensities are nonspecific but compatible with chronic microvascular ischemic disease. Vascular: No hyperdense vessel identified. Calcific atherosclerosis. Skull: No acute fracture.  Posterior left scalp staples. Sinuses/Orbits: Mostly clear sinuses.  No acute orbital findings. Other: Mastoid effusions. IMPRESSION: 1. No evidence of acute intracranial abnormality. 2. Chronic microvascular ischemic disease. Electronically Signed   By: Gilmore GORMAN Molt M.D.   On: 08/19/2023 20:07   CT Head Wo Contrast Result Date: 08/13/2023 CLINICAL DATA:  Head and neck trauma. Mechanical fall with posterior head injury EXAM: CT HEAD WITHOUT CONTRAST CT CERVICAL SPINE WITHOUT CONTRAST TECHNIQUE: Multidetector CT  imaging of the head and cervical spine was performed following the standard protocol without intravenous contrast. Multiplanar CT image reconstructions of the cervical spine were also generated. RADIATION DOSE REDUCTION: This exam was performed according to the departmental dose-optimization program which includes automated exposure control, adjustment of the mA and/or kV according to patient size and/or use of iterative reconstruction technique. COMPARISON:  12/04/2012 head CT FINDINGS: CT HEAD FINDINGS Brain: No evidence of acute infarction, hemorrhage, hydrocephalus, extra-axial collection or mass lesion/mass effect. Age normal appearance of the brain Vascular: No hyperdense vessel or unexpected calcification. Skull: Posterior scalp swelling without calvarial fracture. Sinuses/Orbits: No evidence of injury CT CERVICAL SPINE FINDINGS Alignment: No traumatic malalignment. Degenerative anterolisthesis at C6-7 and C7-T1. Skull base and vertebrae: No acute fracture. No primary bone lesion or focal pathologic process. Intervertebral ankylosis at C3-4 and facet ankylosis at C4-5. Soft tissues and spinal canal: No prevertebral fluid or swelling. No visible canal hematoma. Extensive dystrophic calcification especially at the supraspinous ligament of the upper thoracic spine. Disc levels: Generalized degenerative disc narrowing with endplate and facet spurring. Upper chest: Clear apical lungs IMPRESSION: 1. No evidence of acute intracranial or cervical spine injury. 2. Posterior scalp injury without calvarial fracture. Electronically Signed   By: Dorn Roulette M.D.   On: 08/13/2023 11:18   CT Cervical Spine Wo Contrast Result Date: 08/13/2023 CLINICAL DATA:  Head and neck trauma. Mechanical fall with posterior head injury EXAM: CT HEAD WITHOUT CONTRAST CT CERVICAL SPINE WITHOUT CONTRAST TECHNIQUE: Multidetector CT imaging of the head and cervical spine was performed following the standard protocol without intravenous  contrast. Multiplanar CT image reconstructions of the cervical spine were also generated. RADIATION DOSE REDUCTION: This exam was performed according to the departmental dose-optimization program which includes automated exposure control, adjustment of the mA and/or kV according to patient size and/or use of iterative reconstruction technique. COMPARISON:  12/04/2012 head CT FINDINGS: CT HEAD FINDINGS Brain: No evidence of acute infarction, hemorrhage, hydrocephalus, extra-axial collection or mass lesion/mass effect. Age normal appearance of the brain Vascular: No hyperdense vessel or unexpected calcification. Skull: Posterior scalp swelling without calvarial fracture. Sinuses/Orbits: No evidence of injury CT CERVICAL SPINE FINDINGS Alignment: No traumatic malalignment. Degenerative anterolisthesis at C6-7 and C7-T1. Skull base and vertebrae: No acute fracture. No primary bone lesion or focal pathologic process. Intervertebral ankylosis at C3-4 and facet ankylosis at C4-5. Soft tissues and spinal canal: No prevertebral fluid or swelling. No visible canal hematoma. Extensive dystrophic calcification especially at the supraspinous ligament of the upper thoracic spine. Disc levels: Generalized degenerative disc narrowing with endplate and facet spurring. Upper chest: Clear apical lungs IMPRESSION: 1. No evidence of acute intracranial or cervical spine injury. 2. Posterior scalp injury without calvarial fracture. Electronically Signed   By: Dorn Roulette M.D.   On: 08/13/2023 11:18      Subjective: Seen and examined on day of discharge.  Stable,  appropriate for dispo to SNF  Discharge Exam: Vitals:   08/28/23 1500 08/29/23 0931  BP: 120/61 (!) 118/99  Pulse: 89 81  Resp: 16 16  Temp: 98.3 F (36.8 C) 97.7 F (36.5 C)  SpO2: 98% 98%   Vitals:   08/27/23 2211 08/28/23 0741 08/28/23 1500 08/29/23 0931  BP: (!) 104/50 (!) 107/50 120/61 (!) 118/99  Pulse: 60 85 89 81  Resp: 19 16 16 16   Temp: 98.1  F (36.7 C) 97.7 F (36.5 C) 98.3 F (36.8 C) 97.7 F (36.5 C)  TempSrc: Oral     SpO2: 98% 95% 98% 98%  Weight:      Height:        General: Pt is alert, awake, not in acute distress Cardiovascular: RRR, S1/S2 +, no rubs, no gallops Respiratory: CTA bilaterally, no wheezing, no rhonchi Abdominal: Soft, NT, ND, bowel sounds + Extremities: no edema, no cyanosis    The results of significant diagnostics from this hospitalization (including imaging, microbiology, ancillary and laboratory) are listed below for reference.     Microbiology: Recent Results (from the past 240 hours)  Resp panel by RT-PCR (RSV, Flu A&B, Covid) Anterior Nasal Swab     Status: Abnormal   Collection Time: 08/19/23  7:34 PM   Specimen: Anterior Nasal Swab  Result Value Ref Range Status   SARS Coronavirus 2 by RT PCR NEGATIVE NEGATIVE Final    Comment: (NOTE) SARS-CoV-2 target nucleic acids are NOT DETECTED.  The SARS-CoV-2 RNA is generally detectable in upper respiratory specimens during the acute phase of infection. The lowest concentration of SARS-CoV-2 viral copies this assay can detect is 138 copies/mL. A negative result does not preclude SARS-Cov-2 infection and should not be used as the sole basis for treatment or other patient management decisions. A negative result may occur with  improper specimen collection/handling, submission of specimen other than nasopharyngeal swab, presence of viral mutation(s) within the areas targeted by this assay, and inadequate number of viral copies(<138 copies/mL). A negative result must be combined with clinical observations, patient history, and epidemiological information. The expected result is Negative.  Fact Sheet for Patients:  bloggercourse.com  Fact Sheet for Healthcare Providers:  seriousbroker.it  This test is no t yet approved or cleared by the [place]  FDA and  has been authorized for  detection and/or diagnosis of SARS-CoV-2 by FDA under an Emergency Use Authorization (EUA). This EUA will remain  in effect (meaning this test can be used) for the duration of the COVID-19 declaration under Section 564(b)(1) of the Act, 21 U.S.C.section 360bbb-3(b)(1), unless the authorization is terminated  or revoked sooner.       Influenza A by PCR NEGATIVE NEGATIVE Final   Influenza B by PCR NEGATIVE NEGATIVE Final    Comment: (NOTE) The Xpert Xpress SARS-CoV-2/FLU/RSV plus assay is intended as an aid in the diagnosis of influenza from Nasopharyngeal swab specimens and should not be used as a sole basis for treatment. Nasal washings and aspirates are unacceptable for Xpert Xpress SARS-CoV-2/FLU/RSV testing.  Fact Sheet for Patients: bloggercourse.com  Fact Sheet for Healthcare Providers: seriousbroker.it  This test is not yet approved or cleared by the [place]  FDA and has been authorized for detection and/or diagnosis of SARS-CoV-2 by FDA under an Emergency Use Authorization (EUA). This EUA will remain in effect (meaning this test can be used) for the duration of the COVID-19 declaration under Section 564(b)(1) of the Act, 21 U.S.C. section 360bbb-3(b)(1), unless the authorization is terminated or revoked.  Resp Syncytial Virus by PCR POSITIVE (A) NEGATIVE Final    Comment: (NOTE) Fact Sheet for Patients: bloggercourse.com  Fact Sheet for Healthcare Providers: seriousbroker.it  This test is not yet approved or cleared by the [place]  FDA and has been authorized for detection and/or diagnosis of SARS-CoV-2 by FDA under an Emergency Use Authorization (EUA). This EUA will remain in effect (meaning this test can be used) for the duration of the COVID-19 declaration under Section 564(b)(1) of the Act, 21 U.S.C. section 360bbb-3(b)(1), unless the authorization is  terminated or revoked.  Performed at [facility], [address] [place], [place] [address]   Culture, blood (routine x 2)     Status: None   Collection Time: 08/20/23  2:38 AM   Specimen: BLOOD  Result Value Ref Range Status   Specimen Description BLOOD RIGHT ARM  Final   Special Requests   Final    BOTTLES DRAWN AEROBIC AND ANAEROBIC Blood Culture results may not be optimal due to an inadequate volume of blood received in culture bottles   Culture   Final    NO GROWTH 5 DAYS Performed at [facility], [address] [place], [place] [address]    Report Status 08/25/2023 FINAL  Final  Urine Culture     Status: None   Collection Time: 08/20/23  3:33 AM   Specimen: In/Out Cath Urine  Result Value Ref Range Status   Specimen Description   Final    IN/OUT CATH URINE Performed at [facility], [address], [place], [place] [address]    Special Requests   Final    NONE Performed at [facility], [address] [place], [place] [address]    Culture   Final    NO GROWTH Performed at [facility], [address], [place], [place] [address]    Report Status 08/21/2023 FINAL  Final  Culture, blood (routine x 2)     Status: None   Collection Time: 08/20/23  6:56 AM   Specimen: BLOOD LEFT HAND  Result Value Ref Range Status   Specimen Description BLOOD LEFT HAND  Final   Special Requests   Final    BOTTLES DRAWN AEROBIC AND ANAEROBIC Blood Culture results may not be optimal due to an inadequate volume of blood received in culture bottles   Culture   Final    NO GROWTH 5 DAYS Performed at [facility], [address] [place], [place] [address]    Report Status 08/25/2023 FINAL  Final     Labs: BNP (last 3 results) No results for input(s): BNP in the last 8760 hours. Basic Metabolic Panel: Recent Labs  Lab 08/24/23 0249  NA 137  K 3.7  CL 105  CO2 23  GLUCOSE 97  BUN 16  CREATININE 0.64  CALCIUM 9.1    Liver Function Tests: No results for input(s): AST, ALT, ALKPHOS, BILITOT, PROT, ALBUMIN in the last 168 hours. No results for input(s): LIPASE, AMYLASE in the last 168 hours. No results for input(s): AMMONIA in the last 168 hours. CBC: Recent Labs  Lab 08/24/23 0249  WBC 9.1  NEUTROABS 7.3  HGB 11.8*  HCT 34.5*  MCV 94.8  PLT 272   Cardiac Enzymes: No results for input(s): CKTOTAL, CKMB, CKMBINDEX, TROPONINI in the last 168 hours. BNP: Invalid input(s): POCBNP CBG: No results for input(s): GLUCAP in the last 168 hours. D-Dimer No results for input(s): DDIMER in the last 72 hours. Hgb A1c No  results for input(s): HGBA1C in the last 72 hours. Lipid Profile No results for input(s): CHOL, HDL, LDLCALC, TRIG, CHOLHDL, LDLDIRECT in the last 72 hours. Thyroid function studies No results for input(s): TSH, T4TOTAL, T3FREE, THYROIDAB in the last 72 hours.  Invalid input(s): FREET3 Anemia work up No results for input(s): VITAMINB12, FOLATE, FERRITIN, TIBC, IRON, RETICCTPCT in the last 72 hours. Urinalysis    Component Value Date/Time   COLORURINE YELLOW (A) 08/20/2023 0333   APPEARANCEUR CLEAR (A) 08/20/2023 0333   APPEARANCEUR Hazy 07/25/2012 1734   LABSPEC 1.019 08/20/2023 0333   LABSPEC 1.014 07/25/2012 1734   PHURINE 5.0 08/20/2023 0333   GLUCOSEU 50 (A) 08/20/2023 0333   GLUCOSEU Negative 07/25/2012 1734   HGBUR NEGATIVE 08/20/2023 0333   BILIRUBINUR NEGATIVE 08/20/2023 0333   BILIRUBINUR Negative 07/25/2012 1734   KETONESUR NEGATIVE 08/20/2023 0333   PROTEINUR NEGATIVE 08/20/2023 0333   NITRITE NEGATIVE 08/20/2023 0333   LEUKOCYTESUR NEGATIVE 08/20/2023 0333   LEUKOCYTESUR Trace 07/25/2012 1734   Sepsis Labs Recent Labs  Lab 08/24/23 0249  WBC 9.1   Microbiology Recent Results (from the past 240 hours)  Resp panel by RT-PCR (RSV, Flu A&B, Covid) Anterior Nasal Swab     Status: Abnormal    Collection Time: 08/19/23  7:34 PM   Specimen: Anterior Nasal Swab  Result Value Ref Range Status   SARS Coronavirus 2 by RT PCR NEGATIVE NEGATIVE Final    Comment: (NOTE) SARS-CoV-2 target nucleic acids are NOT DETECTED.  The SARS-CoV-2 RNA is generally detectable in upper respiratory specimens during the acute phase of infection. The lowest concentration of SARS-CoV-2 viral copies this assay can detect is 138 copies/mL. A negative result does not preclude SARS-Cov-2 infection and should not be used as the sole basis for treatment or other patient management decisions. A negative result may occur with  improper specimen collection/handling, submission of specimen other than nasopharyngeal swab, presence of viral mutation(s) within the areas targeted by this assay, and inadequate number of viral copies(<138 copies/mL). A negative result must be combined with clinical observations, patient history, and epidemiological information. The expected result is Negative.  Fact Sheet for Patients:  bloggercourse.com  Fact Sheet for Healthcare Providers:  seriousbroker.it  This test is no t yet approved or cleared by the [place]  FDA and  has been authorized for detection and/or diagnosis of SARS-CoV-2 by FDA under an Emergency Use Authorization (EUA). This EUA will remain  in effect (meaning this test can be used) for the duration of the COVID-19 declaration under Section 564(b)(1) of the Act, 21 U.S.C.section 360bbb-3(b)(1), unless the authorization is terminated  or revoked sooner.       Influenza A by PCR NEGATIVE NEGATIVE Final   Influenza B by PCR NEGATIVE NEGATIVE Final    Comment: (NOTE) The Xpert Xpress SARS-CoV-2/FLU/RSV plus assay is intended as an aid in the diagnosis of influenza from Nasopharyngeal swab specimens and should not be used as a sole basis for treatment. Nasal washings and aspirates are unacceptable for  Xpert Xpress SARS-CoV-2/FLU/RSV testing.  Fact Sheet for Patients: bloggercourse.com  Fact Sheet for Healthcare Providers: seriousbroker.it  This test is not yet approved or cleared by the [place]  FDA and has been authorized for detection and/or diagnosis of SARS-CoV-2 by FDA under an Emergency Use Authorization (EUA). This EUA will remain in effect (meaning this test can be used) for the duration of the COVID-19 declaration under Section 564(b)(1) of the Act, 21 U.S.C. section 360bbb-3(b)(1), unless the authorization is terminated  or revoked.     Resp Syncytial Virus by PCR POSITIVE (A) NEGATIVE Final    Comment: (NOTE) Fact Sheet for Patients: bloggercourse.com  Fact Sheet for Healthcare Providers: seriousbroker.it  This test is not yet approved or cleared by the [place]  FDA and has been authorized for detection and/or diagnosis of SARS-CoV-2 by FDA under an Emergency Use Authorization (EUA). This EUA will remain in effect (meaning this test can be used) for the duration of the COVID-19 declaration under Section 564(b)(1) of the Act, 21 U.S.C. section 360bbb-3(b)(1), unless the authorization is terminated or revoked.  Performed at [facility], [address] [place], [place] [address]   Culture, blood (routine x 2)     Status: None   Collection Time: 08/20/23  2:38 AM   Specimen: BLOOD  Result Value Ref Range Status   Specimen Description BLOOD RIGHT ARM  Final   Special Requests   Final    BOTTLES DRAWN AEROBIC AND ANAEROBIC Blood Culture results may not be optimal due to an inadequate volume of blood received in culture bottles   Culture   Final    NO GROWTH 5 DAYS Performed at [facility], [address] [place], [place] [address]    Report Status 08/25/2023 FINAL  Final  Urine Culture     Status: None   Collection Time: 08/20/23   3:33 AM   Specimen: In/Out Cath Urine  Result Value Ref Range Status   Specimen Description   Final    IN/OUT CATH URINE Performed at [facility], [address] [place], [place] [address]    Special Requests   Final    NONE Performed at [facility], [address] [place], [place] [address]    Culture   Final    NO GROWTH Performed at [facility], [address] [place], [place] [address]    Report Status 08/21/2023 FINAL  Final  Culture, blood (routine x 2)     Status: None   Collection Time: 08/20/23  6:56 AM   Specimen: BLOOD LEFT HAND  Result Value Ref Range Status   Specimen Description BLOOD LEFT HAND  Final   Special Requests   Final    BOTTLES DRAWN AEROBIC AND ANAEROBIC Blood Culture results may not be optimal due to an inadequate volume of blood received in culture bottles   Culture   Final    NO GROWTH 5 DAYS Performed at [facility], [address] [place], [place] [address]    Report Status 08/25/2023 FINAL  Final     Time coordinating discharge: Over 30 minutes  SIGNED:   Calvin KATHEE Robson, MD  [facility] 08/29/2023, 10:32 AM Pager   If 7PM-7AM, please contact night-coverage

## 2023-08-29 NOTE — Discharge Instructions (Signed)
 Follow up with PCP and pick up medications from pharmacy. Make sure to take all antibiotics. Do not double up on narcotic/pain medication or drink alcohol during this time. Do not drive while taking narcotic medication. Call 911 or return to ER for life threatening issues or other concerns.

## 2023-08-29 NOTE — Progress Notes (Signed)
 Physical Therapy Treatment Patient Details Name: Amy [name] MRN: [id number] DOB: 03/29/1927 Today's Date: 08/29/2023   History of Present Illness Pt is a 89 y.o. female brought to the ED from home by her daughter with a chief complaint of cough, altered mental status and generalized weakness with recent fall history.  MD assessment includes: altered mental status/underlying dementia/acute RSV infection, protein calorie malnutrition, difficulty swallowing, and slight elevation of troponin due to demand ischemia. 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    PT Comments  Pt alert, oriented to self, denied pain. Pt with improved bed mobility today; able to transfer to EOB with verbal/tactile cues, and use of bed rail, CGA. Pt somewhat self limiting but able to maximize participation with encouragement. Noted to be soiled. Step pivot to [facility] with minAx2 and totalA for a seated bath, gown/underwear change. Pt stated she needed to continue to have BM, left with OT at bedside. The patient would benefit from further skilled PT intervention to continue to progress towards goals.    If plan is discharge home, recommend the following: Two people to help with walking and/or transfers;A lot of help with bathing/dressing/bathroom;Assistance with cooking/housework;Direct supervision/assist for medications management;Assist for transportation;Help with stairs or ramp for entrance;Direct supervision/assist for financial management;Supervision due to cognitive status   Can travel by private vehicle     No  Equipment Recommendations  Other (comment) (TBD)    Recommendations for Other Services       Precautions / Restrictions Precautions Precautions: Fall Restrictions Weight Bearing Restrictions Per Provider Order: No     Mobility  Bed Mobility Overal bed mobility: Needs Assistance Bed Mobility: Supine to Sit     Supine to sit: Contact  guard, Used rails, HOB elevated     General bed mobility comments: extra time but pt able to complete with CGA. pt self limiting but able to maximize with encouragement    Transfers Overall transfer level: Needs assistance Equipment used: 2 person hand held assist Transfers: Sit to/from Stand, Bed to chair/wheelchair/BSC Sit to Stand: Min assist, +2 physical assistance   Step pivot transfers: Min assist       General transfer comment: extra time and minAx2 throughout but pt able to actively step in order to pivot    Ambulation/Gait                   Stairs             Wheelchair Mobility     Tilt Bed    Modified Rankin (Stroke Patients Only)       Balance Overall balance assessment: Needs assistance Sitting-balance support: No upper extremity supported, Feet supported Sitting balance-Leahy Scale: Fair     Standing balance support: Bilateral upper extremity supported, Reliant on assistive device for balance, During functional activity Standing balance-Leahy Scale: Poor                              Cognition Arousal: Alert Behavior During Therapy: WFL for tasks assessed/performed Overall Cognitive Status: History of cognitive impairments - at baseline                                          Exercises      General Comments  Pertinent Vitals/Pain Pain Assessment Pain Assessment: No/denies pain    Home Living                          Prior Function            PT Goals (current goals can now be found in the care plan section) Progress towards PT goals: Progressing toward goals    Frequency    Min 1X/week      PT Plan      Co-evaluation              AM-PAC PT 6 Clicks Mobility   Outcome Measure  Help needed turning from your back to your side while in a flat bed without using bedrails?: A Little Help needed moving from lying on your back to sitting on the side of a flat  bed without using bedrails?: A Little Help needed moving to and from a bed to a chair (including a wheelchair)?: A Little Help needed standing up from a chair using your arms (e.g., wheelchair or bedside chair)?: A Lot Help needed to walk in hospital room?: A Lot Help needed climbing 3-5 steps with a railing? : Total 6 Click Score: 14    End of Session   Activity Tolerance: Patient tolerated treatment well Patient left: Other (comment) (seated on BSC with OT) Nurse Communication: Mobility status PT Visit Diagnosis: Unsteadiness on feet (R26.81);History of falling (Z91.81);Difficulty in walking, not elsewhere classified (R26.2);Muscle weakness (generalized) (M62.81)     Time: 9088-9067 PT Time Calculation (min) (ACUTE ONLY): 21 min  Charges:    $Therapeutic Activity: 8-22 mins PT General Charges $$ ACUTE PT VISIT: 1 Visit                     Doyal Shams PT, DPT 9:42 AM,08/29/23

## 2023-08-29 NOTE — TOC Transition Note (Signed)
 Transition of Care [facility]) - Discharge Note   Patient Details  Name: Amy [name] MRN: [id number] Date of Birth: Sep 10, 1926  Transition of [facility]) CM/SW Contact:  Aitan Rossbach A Kenna Kirn, RN Phone Number: 08/29/2023, 2:16 PM   Clinical Narrative:    Chart reviewed.  I have spoken with Tammy with [facility].  She informs me that she will have a bed for Amy [name] today.  She reports that patient will go to room 506 and the number to call report is [phone].  I have sent Tammy patient's SNF Transfer Report, Discharge Summary and Discharge Orders.    I have informed patient's daughter Amy [name] that patient will be a discharge for today.    I have arranged [facility] to transport patient to the facility today.    I have informed staff nurse of the above information.     Final next level of care: Skilled Nursing Facility Barriers to Discharge: No Barriers Identified   Patient Goals and CMS Choice   CMS Medicare.gov Compare Post Acute Care list provided to::  (Patient's daughter Amy [name]) Choice offered to / list presented to : Adult Children      Discharge Placement              Patient chooses bed at: [facility] [place] Patient to be transferred to facility by: [facility] Name of family member notified: Amy [name] Patient and family notified of of transfer: 08/29/23  Discharge Plan and Services Additional resources added to the After Visit Summary for                                       Social Drivers of Health (SDOH) Interventions SDOH Screenings   Food Insecurity: Patient Unable To Answer (08/20/2023)  Housing: Patient Unable To Answer (08/20/2023)  Transportation Needs: Patient Unable To Answer (08/20/2023)  Utilities: Patient Unable To Answer (08/20/2023)  Financial Resource Strain: Low Risk  (07/24/2023)   Received from [facility]  Social Connections: Patient Unable To Answer (08/20/2023)  Tobacco Use: Low Risk  (08/19/2023)      Readmission Risk Interventions     No data to display

## 2024-04-03 ENCOUNTER — Emergency Department

## 2024-04-03 ENCOUNTER — Other Ambulatory Visit: Payer: Self-pay

## 2024-04-03 ENCOUNTER — Emergency Department: Admission: EM | Admit: 2024-04-03 | Discharge: 2024-04-03 | Disposition: A | Discharge status: Home / Self Care

## 2024-04-03 DIAGNOSIS — Z7901 Long term (current) use of anticoagulants: Secondary | ICD-10-CM | POA: Diagnosis not present

## 2024-04-03 DIAGNOSIS — N3000 Acute cystitis without hematuria: Secondary | ICD-10-CM | POA: Diagnosis not present

## 2024-04-03 DIAGNOSIS — I4891 Unspecified atrial fibrillation: Secondary | ICD-10-CM | POA: Insufficient documentation

## 2024-04-03 DIAGNOSIS — R4182 Altered mental status, unspecified: Secondary | ICD-10-CM | POA: Diagnosis present

## 2024-04-03 DIAGNOSIS — Z87440 Personal history of urinary (tract) infections: Secondary | ICD-10-CM | POA: Diagnosis present

## 2024-04-03 DIAGNOSIS — R531 Weakness: Secondary | ICD-10-CM | POA: Diagnosis not present

## 2024-04-03 DIAGNOSIS — I509 Heart failure, unspecified: Secondary | ICD-10-CM | POA: Diagnosis not present

## 2024-04-03 DIAGNOSIS — I110 Hypertensive heart disease with heart failure: Secondary | ICD-10-CM | POA: Diagnosis not present

## 2024-04-03 LAB — URINALYSIS, COMPLETE (UACMP) WITH MICROSCOPIC
Bilirubin Urine: NEGATIVE
Glucose, UA: NEGATIVE mg/dL
Ketones, ur: NEGATIVE mg/dL
Nitrite: NEGATIVE
Protein, ur: NEGATIVE mg/dL
Specific Gravity, Urine: 1.016 (ref 1.005–1.030)
WBC, UA: >50 WBC/hpf (ref 0–5)
pH: 5 (ref 5.0–8.0)

## 2024-04-03 LAB — CBC WITH DIFFERENTIAL/PLATELET
Abs Immature Granulocytes: 0.04 K/uL (ref 0.00–0.07)
Basophils Absolute: 0.1 K/uL (ref 0.0–0.1)
Basophils Relative: 1 %
Eosinophils Absolute: 0.1 K/uL (ref 0.0–0.5)
Eosinophils Relative: 1 %
HCT: 38.5 % (ref 36.0–46.0)
Hemoglobin: 11.9 g/dL — ABNORMAL LOW (ref 12.0–15.0)
Immature Granulocytes: 1 %
Lymphocytes Relative: 13 %
Lymphs Abs: 1 K/uL (ref 0.7–4.0)
MCH: 26.8 pg (ref 26.0–34.0)
MCHC: 30.9 g/dL (ref 30.0–36.0)
MCV: 86.7 fL (ref 80.0–100.0)
Monocytes Absolute: 0.5 K/uL (ref 0.1–1.0)
Monocytes Relative: 6 %
Neutro Abs: 6 K/uL (ref 1.7–7.7)
Neutrophils Relative %: 78 %
Platelets: 431 K/uL — ABNORMAL HIGH (ref 150–400)
RBC: 4.44 MIL/uL (ref 3.87–5.11)
RDW: 14.4 % (ref 11.5–15.5)
WBC: 7.7 K/uL (ref 4.0–10.5)
nRBC: 0 % (ref 0.0–0.2)

## 2024-04-03 LAB — RESP PANEL BY RT-PCR (RSV, FLU A&B, COVID)  RVPGX2
Influenza A by PCR: NEGATIVE
Influenza B by PCR: NEGATIVE
Resp Syncytial Virus by PCR: NEGATIVE
SARS Coronavirus 2 by RT PCR: NEGATIVE

## 2024-04-03 LAB — COMPREHENSIVE METABOLIC PANEL WITH GFR
ALT: 10 U/L (ref 0–44)
AST: 24 U/L (ref 15–41)
Albumin: 2.9 g/dL — ABNORMAL LOW (ref 3.5–5.0)
Alkaline Phosphatase: 71 U/L (ref 38–126)
Anion gap: 12 (ref 5–15)
BUN: 16 mg/dL (ref 8–23)
CO2: 25 mmol/L (ref 22–32)
Calcium: 9.4 mg/dL (ref 8.9–10.3)
Chloride: 101 mmol/L (ref 98–111)
Creatinine, Ser: 0.81 mg/dL (ref 0.44–1.00)
GFR, Estimated: >60 mL/min (ref >60)
Glucose, Bld: 168 mg/dL — ABNORMAL HIGH (ref 70–99)
Potassium: 3.2 mmol/L — ABNORMAL LOW (ref 3.5–5.1)
Sodium: 138 mmol/L (ref 135–145)
Total Bilirubin: 0.9 mg/dL (ref 0.0–1.2)
Total Protein: 6.5 g/dL (ref 6.5–8.1)

## 2024-04-03 LAB — LIPASE, BLOOD: Lipase: 37 U/L (ref 11–51)

## 2024-04-03 MED ORDER — SODIUM CHLORIDE 0.9 % IV SOLN
2.0000 g | Freq: Once | INTRAVENOUS | Status: AC
  Administered 2024-04-03: 2 g via INTRAVENOUS
  Filled 2024-04-03: qty 20

## 2024-04-03 MED ORDER — CEFPODOXIME PROXETIL 200 MG PO TABS: 200.0000 mg | ORAL_TABLET | Freq: Two times a day (BID) | ORAL | 0 refills | Status: AC

## 2024-04-03 MED ORDER — AZITHROMYCIN 250 MG PO TABS: ORAL_TABLET | ORAL | 0 refills | Status: DC

## 2024-04-03 MED ORDER — SODIUM CHLORIDE 0.9 % IV SOLN: 1.0000 g | Freq: Once | INTRAVENOUS | Status: DC

## 2024-04-03 MED ORDER — DOXYCYCLINE HYCLATE 100 MG PO TABS: 100.0000 mg | ORAL_TABLET | Freq: Two times a day (BID) | ORAL | 0 refills | Status: AC

## 2024-04-03 MED ORDER — CEFPODOXIME PROXETIL 200 MG PO TABS: 200.0000 mg | ORAL_TABLET | Freq: Two times a day (BID) | ORAL | 0 refills | Status: DC

## 2024-04-03 NOTE — ED Notes (Signed)
 Patient in and out catheterized at this time.

## 2024-04-03 NOTE — ED Notes (Signed)
 Called to [facility] @ 847pm per RN Sydni/Transport to [facility]/Rep Pam.

## 2024-04-03 NOTE — ED Provider Notes (Signed)
 [facility] Provider Note    Event Date/Time   First MD Initiated Contact with Patient 04/03/24 1817     (approximate)   History   Altered Mental Status  Patient to ED via [facility] from [facility] [place]. Staff at the patient's SNF has noted the patient to be more altered for the past 3 weeks. Patient has a Hx of UTIs but the SNF has been unable to obtain a urine sample. Patient is currently A&Ox1 to self. Patient uses walker at baseline to ambulate but has been too weak to walk the past few weeks.   EMS Vitals: 98.3 axillary 22 RR 33 EtCO2  70 HR    HPI Amy [name] is a 89 y.o. female PMH A-fib on Eliquis , dyslipidemia, hypertension, PVD, cardiomyopathy EF 45-50% presents for evaluation of altered mental status - Per EMS, patient has been progressively more altered over the past 3 weeks and notably worse over the past week.  They do express concern for possible UTI but have been unable to get a urine sample.  Normally ambulates with walker.  Vital signs stable with EMS, afebrile. - Patient is a noncontributory historian.  Able to follow basic commands though otherwise not able to provide any history.   Per chart review, admitted in January of this year for altered mental status in the setting of RSV.      Physical Exam   Triage Vital Signs: ED Triage Vitals  Encounter Vitals Group     BP 04/03/24 1803 (!) 129/55     Girls Systolic BP Percentile --      Girls Diastolic BP Percentile --      Boys Systolic BP Percentile --      Boys Diastolic BP Percentile --      Pulse Rate 04/03/24 1803 78     Resp 04/03/24 1803 (!) 22     Temp 04/03/24 1803 98.9 F (37.2 C)     Temp Source 04/03/24 1803 Rectal     SpO2 04/03/24 1803 99 %     Weight 04/03/24 1805 133 lb (60.3 kg)     Height 04/03/24 1805 5' 1 (1.549 m)     Head Circumference --      Peak Flow --      Pain Score --      Pain Loc --      Pain Education --      Exclude from Growth  Chart --     Most recent vital signs: Vitals:   04/03/24 1803  BP: (!) 129/55  Pulse: 78  Resp: (!) 22  Temp: 98.9 F (37.2 C)  SpO2: 99%     General: Awake, no distress.  CV:  Good peripheral perfusion. RRR, RP 2+ Resp:  Normal effort. CTAB Abd:  No distention. Nontender to deep palpation throughout Neuro:  Pleasant, interactive, moves all extremities spontaneously, follows basic commands, attempts at conversation however are nonsensical   ED Results / Procedures / Treatments   Labs (all labs ordered are listed, but only abnormal results are displayed) Labs Reviewed  CBC WITH DIFFERENTIAL/PLATELET - Abnormal; Notable for the following components:      Result Value   Hemoglobin 11.9 (*)    Platelets 431 (*)    All other components within normal limits  COMPREHENSIVE METABOLIC PANEL WITH GFR - Abnormal; Notable for the following components:   Potassium 3.2 (*)    Glucose, Bld 168 (*)    Albumin 2.9 (*)  All other components within normal limits  URINALYSIS, COMPLETE (UACMP) WITH MICROSCOPIC - Abnormal; Notable for the following components:   Color, Urine YELLOW (*)    APPearance CLOUDY (*)    Hgb urine dipstick MODERATE (*)    Leukocytes,Ua MODERATE (*)    Bacteria, UA MANY (*)    All other components within normal limits  RESP PANEL BY RT-PCR (RSV, FLU A&B, COVID)  RVPGX2  LIPASE, BLOOD     EKG  Ecg = atrial fibrillation, rate 78, no gross ST elevation or depression, no significant repolarization abnormalities, right axis deviation, normal intervals other than prolonged QT.  No evidence of ischemia on my interpretation.   RADIOLOGY Radiology interpreted by myself and radiology report reviewed.  No acute pathology in head.  Chest x-ray with possible pneumonia versus atelectasis.    PROCEDURES:  Critical Care performed: No  Procedures   MEDICATIONS ORDERED IN ED: Medications  cefTRIAXone  (ROCEPHIN ) 2 g in sodium chloride  0.9 % 100 mL IVPB (2 g  Intravenous New Bag/Given 04/03/24 1943)     IMPRESSION / MDM / ASSESSMENT AND PLAN / ED COURSE  I reviewed the triage vital signs and the nursing notes.                              DDX/MDM/AP: Differential diagnosis includes, but is not limited to, infectious or metabolic encephalopathy--consider electrolyte abnormality, underlying UTI, pneumonia.  Considered but doubt intracranial pathology including mass, hemorrhage.  No clear findings to suggest stroke.  Plan: - Labs - Chest x-ray - EKG - CT head - Will attempt to gather collateral to determine disposition  Patient's presentation is most consistent with acute presentation with potential threat to life or bodily function.  The patient is on the cardiac monitor to evaluate for evidence of arrhythmia and/or significant heart rate changes.  ED course below.  Workup notable for evidence of urinary tract infection.  Equivocal read of possible pneumonia versus atelectasis on chest x-ray, clinically doubt pneumonia.  Treated UTI with IV ceftriaxone .  In shared decision making with family, prefer discharge home as opposed to admission as patient is essentially at her baseline and does have strong support at her assisted living facility with people to help her with her medications and check on her very closely, also note her physical therapy is going to be starting shortly.  No evidence of sepsis at this time.  Rx cefpodoxime  and will also prescribe doxycycline  on abundance of caution though again clinically doubt pneumonia.  Plan for PMD follow-up.  ED return precautions in place.  Family agrees with plan.  Clinical Course as of 04/03/24 2016  Thu Apr 03, 2024  1844 CBC with no leukocytosis, stable mild anemia, mild thrombocytosis [MM]  1923 Urinalysis consistent with infection, will treat [MM]  1924 CXR: IMPRESSION: Minimal patchy opacities in the right mid lung and left lung base may represent atelectasis or infection.   [MM]  1924  CMP with mild hypokalemia, otherwise unremarkable [MM]  1932 CTH: IMPRESSION: 1. No CT evidence for acute intracranial abnormality. 2. Atrophy and chronic small vessel ischemic changes of the white matter.   [MM]  2007 Patient's son and daughter now at bedside, note that she is largely at her baseline --she is very confused at baseline, not able to have a coherent conversation.  Has had limited ambulation over the past month and a half and she had a superficial infection to her left leg which  has now resolved.  Discussed workup being notable for UTI though otherwise reassuring.  Clinically doubt pneumonia given normal lung exam, satting 99% on room air, no leukocytosis, no fever.  Out of abundance of caution, will treat with azithromycin  in addition to cefpodoxime , already treated with ceftriaxone  here.  In shared decision making, family prefers discharge back to facility as opposed to admission into the hospital-note that she has adequate care at her facility and people to help her with medications.  I believe this is a very reasonable plan and will proceed with discharge home. [MM]    Clinical Course User Index [MM] Clarine Ozell LABOR, MD     FINAL CLINICAL IMPRESSION(S) / ED DIAGNOSES   Final diagnoses:  Acute cystitis without hematuria  Weakness     Rx / DC Orders   ED Discharge Orders          Ordered    cefpodoxime  (VANTIN ) 200 MG tablet  2 times daily,   Status:  Discontinued        04/03/24 2010    doxycycline  (VIBRA -TABS) 100 MG tablet  2 times daily        04/03/24 2013    cefpodoxime  (VANTIN ) 200 MG tablet  2 times daily        04/03/24 2013    azithromycin  (ZITHROMAX  Z-PAK) 250 MG tablet  Status:  Discontinued        04/03/24 2010             Note:  This document was prepared using Dragon voice recognition software and may include unintentional dictation errors.   Clarine Ozell LABOR, MD 04/03/24 2016

## 2024-04-03 NOTE — Discharge Instructions (Addendum)
 Your mother's evaluation in the emergency department was notable for a urinary tract infection.  She was treated with a dose of IV antibiotics, and I have also prescribed oral antibiotics to use at home in addition.  The chest x-ray also showed a possibility of a small pneumonia (though I clinically think this is unlikely) --out of an abundance of caution, I have also started her on a second antibiotic (doxycycline ) to use in addition.  She can continue to take all of her home medications.  Please follow-up with her primary care provider for reevaluation, return to the emergency department with any new or worsening symptoms.

## 2024-04-03 NOTE — ED Triage Notes (Addendum)
 Patient to ED via [facility] from [facility] [place]. Staff at the patient's SNF has noted the patient to be more altered for the past 3 weeks. Patient has a Hx of UTIs but the SNF has been unable to obtain a urine sample. Patient is currently A&Ox1 to self. Patient uses walker at baseline to ambulate but has been too weak to walk the past few weeks.   EMS Vitals: 98.3 axillary 22 RR 33 EtCO2  70 HR

## 2024-08-24 DEATH — deceased
